# Patient Record
Sex: Female | Born: 1955 | Race: White | Hispanic: No | Marital: Single
Health system: Southern US, Community
[De-identification: ages and names within clinical notes are randomized; demographics above are authoritative.]

## PROBLEM LIST (undated history)

## (undated) DIAGNOSIS — Z87312 Personal history of (healed) stress fracture: Secondary | ICD-10-CM

## (undated) DIAGNOSIS — D329 Benign neoplasm of meninges, unspecified: Secondary | ICD-10-CM

## (undated) DIAGNOSIS — F909 Attention-deficit hyperactivity disorder, unspecified type: Secondary | ICD-10-CM

## (undated) DIAGNOSIS — F1920 Other psychoactive substance dependence, uncomplicated: Secondary | ICD-10-CM

## (undated) DIAGNOSIS — T7840XA Allergy, unspecified, initial encounter: Secondary | ICD-10-CM

## (undated) DIAGNOSIS — T782XXA Anaphylactic shock, unspecified, initial encounter: Secondary | ICD-10-CM

## (undated) DIAGNOSIS — M2170 Unequal limb length (acquired), unspecified site: Secondary | ICD-10-CM

## (undated) DIAGNOSIS — K589 Irritable bowel syndrome without diarrhea: Secondary | ICD-10-CM

## (undated) HISTORY — DX: Attention-deficit hyperactivity disorder, unspecified type: F90.9

## (undated) HISTORY — DX: Personal history of (healed) stress fracture: Z87.312

## (undated) HISTORY — DX: Benign neoplasm of meninges, unspecified: D32.9

## (undated) HISTORY — DX: Other psychoactive substance dependence, uncomplicated: F19.20

## (undated) HISTORY — DX: Allergy, unspecified, initial encounter: T78.40XA

## (undated) HISTORY — DX: Anaphylactic shock, unspecified, initial encounter: T78.2XXA

## (undated) HISTORY — DX: Irritable bowel syndrome, unspecified: K58.9

## (undated) HISTORY — DX: Unequal limb length (acquired), unspecified site: M21.70

---

## 1998-03-01 ENCOUNTER — Inpatient Hospital Stay (HOSPITAL_COMMUNITY): Admission: AD | Admit: 1998-03-01 | Discharge: 1998-03-03 | Payer: Self-pay | Admitting: Psychiatry

## 1998-03-09 ENCOUNTER — Encounter (HOSPITAL_COMMUNITY): Admission: RE | Admit: 1998-03-09 | Discharge: 1998-06-07 | Payer: Self-pay | Admitting: Psychiatry

## 2000-02-07 ENCOUNTER — Encounter: Payer: Self-pay | Admitting: Obstetrics and Gynecology

## 2000-02-07 ENCOUNTER — Encounter: Admission: RE | Admit: 2000-02-07 | Discharge: 2000-02-07 | Payer: Self-pay | Admitting: Obstetrics and Gynecology

## 2000-08-05 ENCOUNTER — Encounter: Admission: RE | Admit: 2000-08-05 | Discharge: 2000-08-05 | Payer: Self-pay | Admitting: Sports Medicine

## 2000-09-24 DIAGNOSIS — Z87312 Personal history of (healed) stress fracture: Secondary | ICD-10-CM

## 2000-09-24 DIAGNOSIS — T782XXA Anaphylactic shock, unspecified, initial encounter: Secondary | ICD-10-CM

## 2000-09-24 HISTORY — DX: Personal history of (healed) stress fracture: Z87.312

## 2000-09-24 HISTORY — DX: Anaphylactic shock, unspecified, initial encounter: T78.2XXA

## 2000-10-11 ENCOUNTER — Encounter: Admission: RE | Admit: 2000-10-11 | Discharge: 2000-10-11 | Payer: Self-pay | Admitting: Sports Medicine

## 2000-11-22 HISTORY — PX: OTHER SURGICAL HISTORY: SHX169

## 2000-12-09 ENCOUNTER — Encounter: Payer: Self-pay | Admitting: Internal Medicine

## 2000-12-09 ENCOUNTER — Ambulatory Visit (HOSPITAL_COMMUNITY): Admission: RE | Admit: 2000-12-09 | Discharge: 2000-12-09 | Payer: Self-pay | Admitting: Internal Medicine

## 2000-12-13 ENCOUNTER — Encounter: Admission: RE | Admit: 2000-12-13 | Discharge: 2000-12-13 | Payer: Self-pay | Admitting: Family Medicine

## 2000-12-23 ENCOUNTER — Encounter: Admission: RE | Admit: 2000-12-23 | Discharge: 2000-12-23 | Payer: Self-pay | Admitting: Sports Medicine

## 2001-01-10 ENCOUNTER — Encounter: Admission: RE | Admit: 2001-01-10 | Discharge: 2001-01-10 | Payer: Self-pay | Admitting: Sports Medicine

## 2001-02-04 ENCOUNTER — Other Ambulatory Visit: Admission: RE | Admit: 2001-02-04 | Discharge: 2001-02-04 | Payer: Self-pay | Admitting: Obstetrics and Gynecology

## 2001-02-17 ENCOUNTER — Encounter: Payer: Self-pay | Admitting: Obstetrics and Gynecology

## 2001-02-17 ENCOUNTER — Encounter: Admission: RE | Admit: 2001-02-17 | Discharge: 2001-02-17 | Payer: Self-pay | Admitting: Obstetrics and Gynecology

## 2001-03-06 ENCOUNTER — Inpatient Hospital Stay (HOSPITAL_COMMUNITY): Admission: EM | Admit: 2001-03-06 | Discharge: 2001-03-07 | Payer: Self-pay | Admitting: Family Medicine

## 2001-05-02 ENCOUNTER — Encounter: Admission: RE | Admit: 2001-05-02 | Discharge: 2001-05-02 | Payer: Self-pay | Admitting: Family Medicine

## 2001-10-28 ENCOUNTER — Encounter: Admission: RE | Admit: 2001-10-28 | Discharge: 2001-10-28 | Payer: Self-pay | Admitting: Sports Medicine

## 2001-11-11 ENCOUNTER — Encounter: Admission: RE | Admit: 2001-11-11 | Discharge: 2001-11-11 | Payer: Self-pay | Admitting: Sports Medicine

## 2001-11-17 ENCOUNTER — Encounter: Payer: Self-pay | Admitting: Internal Medicine

## 2001-12-02 ENCOUNTER — Encounter: Admission: RE | Admit: 2001-12-02 | Discharge: 2001-12-02 | Payer: Self-pay | Admitting: Sports Medicine

## 2001-12-17 ENCOUNTER — Encounter: Payer: Self-pay | Admitting: Internal Medicine

## 2001-12-21 ENCOUNTER — Emergency Department (HOSPITAL_COMMUNITY): Admission: EM | Admit: 2001-12-21 | Discharge: 2001-12-21 | Payer: Self-pay | Admitting: Emergency Medicine

## 2001-12-26 ENCOUNTER — Encounter: Payer: Self-pay | Admitting: Internal Medicine

## 2001-12-31 ENCOUNTER — Encounter: Admission: RE | Admit: 2001-12-31 | Discharge: 2002-03-31 | Payer: Self-pay | Admitting: Neurosurgery

## 2002-01-08 ENCOUNTER — Encounter: Payer: Self-pay | Admitting: Neurology

## 2002-01-08 ENCOUNTER — Ambulatory Visit (HOSPITAL_COMMUNITY): Admission: RE | Admit: 2002-01-08 | Discharge: 2002-01-08 | Payer: Self-pay | Admitting: Neurology

## 2002-01-19 ENCOUNTER — Encounter: Payer: Self-pay | Admitting: Internal Medicine

## 2002-03-23 ENCOUNTER — Encounter: Payer: Self-pay | Admitting: Internal Medicine

## 2002-03-31 ENCOUNTER — Encounter: Admission: RE | Admit: 2002-03-31 | Discharge: 2002-03-31 | Payer: Self-pay | Admitting: Family Medicine

## 2002-05-18 ENCOUNTER — Encounter: Admission: RE | Admit: 2002-05-18 | Discharge: 2002-05-18 | Payer: Self-pay | Admitting: Family Medicine

## 2002-05-21 ENCOUNTER — Encounter: Payer: Self-pay | Admitting: Obstetrics and Gynecology

## 2002-05-21 ENCOUNTER — Encounter: Admission: RE | Admit: 2002-05-21 | Discharge: 2002-05-21 | Payer: Self-pay | Admitting: Obstetrics and Gynecology

## 2002-05-27 ENCOUNTER — Encounter: Admission: RE | Admit: 2002-05-27 | Discharge: 2002-05-27 | Payer: Self-pay | Admitting: Neurology

## 2002-05-27 ENCOUNTER — Encounter: Payer: Self-pay | Admitting: Neurology

## 2002-06-23 ENCOUNTER — Encounter: Admission: RE | Admit: 2002-06-23 | Discharge: 2002-06-23 | Payer: Self-pay | Admitting: Sports Medicine

## 2002-09-08 ENCOUNTER — Encounter: Admission: RE | Admit: 2002-09-08 | Discharge: 2002-09-08 | Payer: Self-pay | Admitting: Sports Medicine

## 2002-09-22 ENCOUNTER — Encounter: Admission: RE | Admit: 2002-09-22 | Discharge: 2002-09-22 | Payer: Self-pay | Admitting: Sports Medicine

## 2002-09-22 ENCOUNTER — Encounter: Payer: Self-pay | Admitting: Sports Medicine

## 2003-06-15 ENCOUNTER — Encounter: Admission: RE | Admit: 2003-06-15 | Discharge: 2003-06-15 | Payer: Self-pay | Admitting: Sports Medicine

## 2003-07-01 ENCOUNTER — Encounter: Payer: Self-pay | Admitting: Obstetrics and Gynecology

## 2003-07-01 ENCOUNTER — Ambulatory Visit (HOSPITAL_COMMUNITY): Admission: RE | Admit: 2003-07-01 | Discharge: 2003-07-01 | Payer: Self-pay | Admitting: Obstetrics and Gynecology

## 2003-07-06 ENCOUNTER — Other Ambulatory Visit: Admission: RE | Admit: 2003-07-06 | Discharge: 2003-07-06 | Payer: Self-pay | Admitting: Obstetrics and Gynecology

## 2003-08-31 ENCOUNTER — Encounter: Admission: RE | Admit: 2003-08-31 | Discharge: 2003-08-31 | Payer: Self-pay | Admitting: Sports Medicine

## 2004-03-15 ENCOUNTER — Encounter: Admission: RE | Admit: 2004-03-15 | Discharge: 2004-03-15 | Payer: Self-pay | Admitting: Internal Medicine

## 2004-08-25 ENCOUNTER — Ambulatory Visit: Payer: Self-pay | Admitting: Internal Medicine

## 2004-10-06 ENCOUNTER — Ambulatory Visit: Payer: Self-pay | Admitting: Sports Medicine

## 2004-10-23 ENCOUNTER — Ambulatory Visit: Payer: Self-pay | Admitting: Internal Medicine

## 2004-11-09 ENCOUNTER — Ambulatory Visit (HOSPITAL_COMMUNITY): Admission: RE | Admit: 2004-11-09 | Discharge: 2004-11-09 | Payer: Self-pay | Admitting: Obstetrics and Gynecology

## 2004-12-07 ENCOUNTER — Encounter: Admission: RE | Admit: 2004-12-07 | Discharge: 2004-12-07 | Payer: Self-pay | Admitting: Obstetrics and Gynecology

## 2004-12-22 ENCOUNTER — Ambulatory Visit: Payer: Self-pay | Admitting: Sports Medicine

## 2005-05-16 ENCOUNTER — Ambulatory Visit: Payer: Self-pay | Admitting: Internal Medicine

## 2005-07-06 ENCOUNTER — Ambulatory Visit: Payer: Self-pay | Admitting: Internal Medicine

## 2005-10-30 ENCOUNTER — Ambulatory Visit: Payer: Self-pay | Admitting: Sports Medicine

## 2005-12-23 ENCOUNTER — Encounter: Payer: Self-pay | Admitting: Internal Medicine

## 2005-12-23 LAB — CONVERTED CEMR LAB

## 2005-12-25 ENCOUNTER — Ambulatory Visit: Payer: Self-pay | Admitting: Internal Medicine

## 2005-12-25 ENCOUNTER — Other Ambulatory Visit: Admission: RE | Admit: 2005-12-25 | Discharge: 2005-12-25 | Payer: Self-pay | Admitting: Internal Medicine

## 2005-12-25 ENCOUNTER — Encounter: Payer: Self-pay | Admitting: Internal Medicine

## 2006-01-25 ENCOUNTER — Ambulatory Visit: Payer: Self-pay | Admitting: Internal Medicine

## 2006-02-05 ENCOUNTER — Ambulatory Visit: Payer: Self-pay | Admitting: Internal Medicine

## 2006-07-23 ENCOUNTER — Ambulatory Visit: Payer: Self-pay | Admitting: Sports Medicine

## 2006-11-05 ENCOUNTER — Encounter: Admission: RE | Admit: 2006-11-05 | Discharge: 2006-11-05 | Payer: Self-pay | Admitting: Obstetrics and Gynecology

## 2006-11-15 ENCOUNTER — Ambulatory Visit: Payer: Self-pay | Admitting: Internal Medicine

## 2007-01-28 ENCOUNTER — Encounter: Admission: RE | Admit: 2007-01-28 | Discharge: 2007-01-28 | Payer: Self-pay | Admitting: Obstetrics and Gynecology

## 2007-05-02 ENCOUNTER — Ambulatory Visit: Payer: Self-pay | Admitting: Internal Medicine

## 2007-05-13 ENCOUNTER — Ambulatory Visit: Payer: Self-pay | Admitting: Internal Medicine

## 2007-05-13 ENCOUNTER — Encounter: Payer: Self-pay | Admitting: Internal Medicine

## 2007-05-13 ENCOUNTER — Telehealth: Payer: Self-pay | Admitting: Internal Medicine

## 2007-05-14 ENCOUNTER — Encounter: Payer: Self-pay | Admitting: Internal Medicine

## 2007-05-14 LAB — CONVERTED CEMR LAB: Hep B S AB Quant (Post): 8 milliintl units/mL

## 2007-05-23 ENCOUNTER — Encounter (INDEPENDENT_AMBULATORY_CARE_PROVIDER_SITE_OTHER): Payer: Self-pay | Admitting: Surgery

## 2007-05-23 ENCOUNTER — Ambulatory Visit (HOSPITAL_BASED_OUTPATIENT_CLINIC_OR_DEPARTMENT_OTHER): Admission: RE | Admit: 2007-05-23 | Discharge: 2007-05-23 | Payer: Self-pay | Admitting: Surgery

## 2007-05-27 ENCOUNTER — Telehealth: Payer: Self-pay | Admitting: Internal Medicine

## 2007-05-27 ENCOUNTER — Encounter: Payer: Self-pay | Admitting: Internal Medicine

## 2007-05-27 DIAGNOSIS — Z8719 Personal history of other diseases of the digestive system: Secondary | ICD-10-CM

## 2007-05-28 ENCOUNTER — Ambulatory Visit: Payer: Self-pay | Admitting: Internal Medicine

## 2007-06-24 ENCOUNTER — Ambulatory Visit: Payer: Self-pay | Admitting: Internal Medicine

## 2007-06-26 LAB — CONVERTED CEMR LAB: Hepatitis B-Post: >1000 milliintl units/mL

## 2007-07-12 ENCOUNTER — Encounter: Admission: RE | Admit: 2007-07-12 | Discharge: 2007-07-12 | Payer: Self-pay | Admitting: Sports Medicine

## 2007-09-30 ENCOUNTER — Telehealth: Payer: Self-pay | Admitting: Internal Medicine

## 2008-01-14 ENCOUNTER — Telehealth (INDEPENDENT_AMBULATORY_CARE_PROVIDER_SITE_OTHER): Payer: Self-pay | Admitting: *Deleted

## 2008-05-14 ENCOUNTER — Ambulatory Visit: Payer: Self-pay | Admitting: Family Medicine

## 2008-05-17 ENCOUNTER — Encounter: Admission: RE | Admit: 2008-05-17 | Discharge: 2008-05-17 | Payer: Self-pay | Admitting: Obstetrics and Gynecology

## 2008-05-24 ENCOUNTER — Encounter: Admission: RE | Admit: 2008-05-24 | Discharge: 2008-05-24 | Payer: Self-pay | Admitting: Obstetrics and Gynecology

## 2008-08-02 ENCOUNTER — Encounter: Payer: Self-pay | Admitting: Internal Medicine

## 2008-12-20 ENCOUNTER — Telehealth: Payer: Self-pay | Admitting: Internal Medicine

## 2009-01-17 ENCOUNTER — Telehealth: Payer: Self-pay | Admitting: Internal Medicine

## 2009-02-02 ENCOUNTER — Telehealth (INDEPENDENT_AMBULATORY_CARE_PROVIDER_SITE_OTHER): Payer: Self-pay | Admitting: *Deleted

## 2009-02-03 ENCOUNTER — Telehealth: Payer: Self-pay | Admitting: Family Medicine

## 2009-02-03 ENCOUNTER — Ambulatory Visit: Payer: Self-pay | Admitting: Internal Medicine

## 2009-02-03 DIAGNOSIS — L255 Unspecified contact dermatitis due to plants, except food: Secondary | ICD-10-CM

## 2009-02-03 DIAGNOSIS — K5900 Constipation, unspecified: Secondary | ICD-10-CM | POA: Insufficient documentation

## 2009-02-04 ENCOUNTER — Telehealth: Payer: Self-pay | Admitting: Internal Medicine

## 2009-02-09 ENCOUNTER — Telehealth: Payer: Self-pay | Admitting: Internal Medicine

## 2009-02-11 ENCOUNTER — Ambulatory Visit: Payer: Self-pay | Admitting: Gastroenterology

## 2009-02-11 DIAGNOSIS — K6289 Other specified diseases of anus and rectum: Secondary | ICD-10-CM

## 2009-02-11 DIAGNOSIS — N949 Unspecified condition associated with female genital organs and menstrual cycle: Secondary | ICD-10-CM | POA: Insufficient documentation

## 2009-02-14 ENCOUNTER — Encounter: Payer: Self-pay | Admitting: Internal Medicine

## 2009-02-14 ENCOUNTER — Telehealth: Payer: Self-pay | Admitting: Gastroenterology

## 2009-02-14 ENCOUNTER — Telehealth: Payer: Self-pay | Admitting: Family Medicine

## 2009-02-15 ENCOUNTER — Telehealth: Payer: Self-pay | Admitting: Internal Medicine

## 2009-03-15 ENCOUNTER — Ambulatory Visit: Payer: Self-pay | Admitting: Internal Medicine

## 2009-03-18 LAB — CONVERTED CEMR LAB
Basophils Relative: 0 % (ref 0.0–3.0)
Bilirubin, Direct: 0 mg/dL (ref 0.0–0.3)
Eosinophils Absolute: 0.1 10*3/uL (ref 0.0–0.7)
Lipase: 2 units/L — ABNORMAL LOW (ref 11.0–59.0)
Lymphocytes Relative: 32.9 % (ref 12.0–46.0)
Lymphs Abs: 2.2 10*3/uL (ref 0.7–4.0)
MCHC: 34.6 g/dL (ref 30.0–36.0)
Monocytes Absolute: 0.3 10*3/uL (ref 0.1–1.0)
Neutro Abs: 4.1 10*3/uL (ref 1.4–7.7)
Neutrophils Relative %: 60 % (ref 43.0–77.0)
Platelets: 273 10*3/uL (ref 150.0–400.0)
RDW: 12.2 % (ref 11.5–14.6)
Total Bilirubin: 0.4 mg/dL (ref 0.3–1.2)
Total Protein: 7.2 g/dL (ref 6.0–8.3)
WBC: 6.7 10*3/uL (ref 4.5–10.5)

## 2009-03-21 ENCOUNTER — Telehealth (INDEPENDENT_AMBULATORY_CARE_PROVIDER_SITE_OTHER): Payer: Self-pay | Admitting: *Deleted

## 2009-04-29 ENCOUNTER — Ambulatory Visit: Payer: Self-pay | Admitting: Internal Medicine

## 2009-04-29 DIAGNOSIS — H9209 Otalgia, unspecified ear: Secondary | ICD-10-CM | POA: Insufficient documentation

## 2009-05-13 ENCOUNTER — Ambulatory Visit: Payer: Self-pay | Admitting: Internal Medicine

## 2009-05-14 DIAGNOSIS — J309 Allergic rhinitis, unspecified: Secondary | ICD-10-CM | POA: Insufficient documentation

## 2009-05-21 ENCOUNTER — Telehealth: Payer: Self-pay | Admitting: Internal Medicine

## 2009-06-07 ENCOUNTER — Telehealth: Payer: Self-pay | Admitting: *Deleted

## 2009-07-05 ENCOUNTER — Telehealth: Payer: Self-pay | Admitting: *Deleted

## 2009-08-05 ENCOUNTER — Telehealth: Payer: Self-pay | Admitting: Internal Medicine

## 2009-08-09 ENCOUNTER — Telehealth: Payer: Self-pay | Admitting: *Deleted

## 2009-08-09 ENCOUNTER — Telehealth: Payer: Self-pay | Admitting: Internal Medicine

## 2009-08-23 ENCOUNTER — Encounter (INDEPENDENT_AMBULATORY_CARE_PROVIDER_SITE_OTHER): Payer: Self-pay | Admitting: *Deleted

## 2009-08-24 ENCOUNTER — Encounter (INDEPENDENT_AMBULATORY_CARE_PROVIDER_SITE_OTHER): Payer: Self-pay

## 2009-09-09 ENCOUNTER — Telehealth: Payer: Self-pay | Admitting: Internal Medicine

## 2009-09-15 ENCOUNTER — Encounter: Admission: RE | Admit: 2009-09-15 | Discharge: 2009-09-15 | Payer: Self-pay | Admitting: Obstetrics and Gynecology

## 2009-10-10 ENCOUNTER — Telehealth: Payer: Self-pay | Admitting: Internal Medicine

## 2009-10-18 ENCOUNTER — Telehealth: Payer: Self-pay | Admitting: Gastroenterology

## 2009-10-20 ENCOUNTER — Telehealth: Payer: Self-pay | Admitting: Gastroenterology

## 2009-10-31 ENCOUNTER — Telehealth: Payer: Self-pay | Admitting: *Deleted

## 2009-11-02 ENCOUNTER — Encounter: Admission: RE | Admit: 2009-11-02 | Discharge: 2009-11-02 | Payer: Self-pay | Admitting: Internal Medicine

## 2009-11-07 ENCOUNTER — Telehealth: Payer: Self-pay | Admitting: *Deleted

## 2009-11-17 ENCOUNTER — Encounter: Payer: Self-pay | Admitting: Internal Medicine

## 2009-12-06 ENCOUNTER — Telehealth: Payer: Self-pay | Admitting: Internal Medicine

## 2010-01-02 ENCOUNTER — Encounter: Admission: RE | Admit: 2010-01-02 | Discharge: 2010-01-02 | Payer: Self-pay | Admitting: Neurosurgery

## 2010-01-16 ENCOUNTER — Ambulatory Visit: Payer: Self-pay | Admitting: Internal Medicine

## 2010-01-16 DIAGNOSIS — T782XXA Anaphylactic shock, unspecified, initial encounter: Secondary | ICD-10-CM

## 2010-01-16 DIAGNOSIS — R51 Headache: Secondary | ICD-10-CM

## 2010-01-16 DIAGNOSIS — R519 Headache, unspecified: Secondary | ICD-10-CM | POA: Insufficient documentation

## 2010-01-16 DIAGNOSIS — F909 Attention-deficit hyperactivity disorder, unspecified type: Secondary | ICD-10-CM | POA: Insufficient documentation

## 2010-02-03 ENCOUNTER — Telehealth: Payer: Self-pay | Admitting: Internal Medicine

## 2010-02-08 ENCOUNTER — Telehealth: Payer: Self-pay | Admitting: Internal Medicine

## 2010-02-10 ENCOUNTER — Telehealth: Payer: Self-pay | Admitting: Internal Medicine

## 2010-02-15 ENCOUNTER — Ambulatory Visit: Payer: Self-pay | Admitting: Internal Medicine

## 2010-02-15 ENCOUNTER — Telehealth: Payer: Self-pay | Admitting: Internal Medicine

## 2010-02-15 DIAGNOSIS — R209 Unspecified disturbances of skin sensation: Secondary | ICD-10-CM

## 2010-02-15 LAB — CONVERTED CEMR LAB
ALT: 14 units/L (ref 0–35)
AST: 20 units/L (ref 0–37)
Albumin: 3.6 g/dL (ref 3.5–5.2)
Alkaline Phosphatase: 84 units/L (ref 39–117)
Basophils Absolute: 0.1 10*3/uL (ref 0.0–0.1)
CO2: 27 meq/L (ref 19–32)
Calcium: 9.4 mg/dL (ref 8.4–10.5)
Eosinophils Absolute: 0.2 10*3/uL (ref 0.0–0.7)
Eosinophils Relative: 3.8 % (ref 0.0–5.0)
Glucose, Bld: 87 mg/dL (ref 70–99)
Hemoglobin: 12.3 g/dL (ref 12.0–15.0)
Lymphs Abs: 1.7 10*3/uL (ref 0.7–4.0)
MCHC: 33.6 g/dL (ref 30.0–36.0)
MCV: 92.2 fL (ref 78.0–100.0)
Monocytes Absolute: 0.4 10*3/uL (ref 0.1–1.0)
Monocytes Relative: 6.1 % (ref 3.0–12.0)
RDW: 13.2 % (ref 11.5–14.6)
WBC: 5.9 10*3/uL (ref 4.5–10.5)

## 2010-02-24 LAB — CONVERTED CEMR LAB
ANA Titer 1: NEGATIVE
Anti Nuclear Antibody(ANA): POSITIVE — AB
HCV Ab: NEGATIVE

## 2010-02-28 ENCOUNTER — Telehealth: Payer: Self-pay | Admitting: Internal Medicine

## 2010-03-02 ENCOUNTER — Emergency Department (HOSPITAL_COMMUNITY): Admission: EM | Admit: 2010-03-02 | Discharge: 2010-03-02 | Payer: Self-pay | Admitting: Emergency Medicine

## 2010-03-02 ENCOUNTER — Encounter: Payer: Self-pay | Admitting: Internal Medicine

## 2010-03-15 ENCOUNTER — Telehealth: Payer: Self-pay | Admitting: Internal Medicine

## 2010-03-22 ENCOUNTER — Telehealth: Payer: Self-pay | Admitting: Internal Medicine

## 2010-03-22 ENCOUNTER — Ambulatory Visit: Payer: Self-pay | Admitting: Infectious Diseases

## 2010-04-07 ENCOUNTER — Encounter: Payer: Self-pay | Admitting: Internal Medicine

## 2010-04-26 ENCOUNTER — Telehealth: Payer: Self-pay | Admitting: *Deleted

## 2010-06-16 ENCOUNTER — Telehealth: Payer: Self-pay | Admitting: Family Medicine

## 2010-07-27 ENCOUNTER — Telehealth: Payer: Self-pay | Admitting: Internal Medicine

## 2010-08-23 ENCOUNTER — Ambulatory Visit: Payer: Self-pay | Admitting: Sports Medicine

## 2010-08-23 DIAGNOSIS — M7750 Other enthesopathy of unspecified foot: Secondary | ICD-10-CM | POA: Insufficient documentation

## 2010-08-23 DIAGNOSIS — R269 Unspecified abnormalities of gait and mobility: Secondary | ICD-10-CM

## 2010-08-23 DIAGNOSIS — M2170 Unequal limb length (acquired), unspecified site: Secondary | ICD-10-CM

## 2010-08-23 DIAGNOSIS — M533 Sacrococcygeal disorders, not elsewhere classified: Secondary | ICD-10-CM | POA: Insufficient documentation

## 2010-08-29 ENCOUNTER — Telehealth: Payer: Self-pay | Admitting: *Deleted

## 2010-08-31 ENCOUNTER — Emergency Department (HOSPITAL_COMMUNITY): Admission: EM | Admit: 2010-08-31 | Discharge: 2010-03-08 | Payer: Self-pay | Admitting: Emergency Medicine

## 2010-09-28 ENCOUNTER — Telehealth: Payer: Self-pay | Admitting: Internal Medicine

## 2010-10-15 ENCOUNTER — Encounter: Payer: Self-pay | Admitting: Internal Medicine

## 2010-10-15 ENCOUNTER — Encounter: Payer: Self-pay | Admitting: Obstetrics and Gynecology

## 2010-10-15 ENCOUNTER — Encounter: Payer: Self-pay | Admitting: Neurosurgery

## 2010-10-16 ENCOUNTER — Encounter: Payer: Self-pay | Admitting: Obstetrics and Gynecology

## 2010-10-16 ENCOUNTER — Encounter: Payer: Self-pay | Admitting: Internal Medicine

## 2010-10-18 ENCOUNTER — Other Ambulatory Visit: Payer: Self-pay | Admitting: Internal Medicine

## 2010-10-18 DIAGNOSIS — Z1239 Encounter for other screening for malignant neoplasm of breast: Secondary | ICD-10-CM

## 2010-10-18 DIAGNOSIS — Z1231 Encounter for screening mammogram for malignant neoplasm of breast: Secondary | ICD-10-CM

## 2010-10-24 NOTE — Assessment & Plan Note (Signed)
Summary: ORTHOTICS,MC   Vital Signs:  Patient profile:   55 year old female Menstrual status:  postmenopausal Height:      61 inches Weight:      150 pounds BMI:     28.44 Pulse rate:   64 / minute BP sitting:   129 / 84  (right arm)  Vitals Entered By: Rochele Pages RN (August 23, 2010 11:31 AM) CC: new orthotics   Referring Provider:  Berniece Andreas, MD Primary Provider:  Berniece Andreas, MD  CC:  new orthotics.  History of Present Illness: Pt reports to clinic for new orthotics.  States that Dr. Darrick Penna has made 2 pairs for her in the past, but she no longer has them.  Reports no problems, but would like a new pair of orthotics.  Ran in the past, but stopped 2 years ago, not doing any type of regular exercise.   Preventive Screening-Counseling & Management  Alcohol-Tobacco     Smoking Status: quit > 6 months     Year Quit: 1986  Allergies: 1)  ! * Bee Sting  Social History: Smoking Status:  quit > 6 months  Physical Exam  General:  Well-developed,well-nourished,in no acute distress; alert,appropriate and cooperative throughout examination Msk:  Halux rigidus rt Large Morton's calus bilaterally Dense heel calus on rt Faber test tighter on rt than left Hip strength- 4+/5 abduction on left, 4-/5 abduction on rt  Rt leg 1 cm shorter than left    Impression & Recommendations:  Problem # 1:  ABNORMALITY OF GAIT (ICD-781.2)  patient has a pronated gait 2/2 marked pes planus and collapse of both long and transverse arch  also has leg length difference causeing some trendelenburg to RT without correction  Patient was fitted for a standard, cushioned, semi-rigid orthotic.  The orthotic was heated and the patient stood on the orthotic blank positioned on the orthotic stand. The patient was positioned in subtalar neutral position and 10 degrees of ankle dorsiflexion in a weight bearing stance. After completion of molding a stable based was applied to the orthotic blank.     The blank was ground to a stable position for weight bearing. 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 Use as needed 2)  Elestrin 0.52 Mg/0.87 Gm (0.06%) Gel (Estradiol) .... Once a day 3)  Prometrium 100 Mg Caps (Progesterone micronized) .Marland Kitchen.. 1 by mouth once daily 4)  Vyvanse 70 Mg Caps (Lisdexamfetamine dimesylate) .Marland Kitchen.. 1 by mouth once daily 5)  Bactrim Ds 800-160 Mg Tabs (Sulfamethoxazole-trimethoprim) .Marland Kitchen.. 1 by mouth two times a day  Patient Instructions: 1)  Do standing hip rotations starting with 3 sets of 10  2)  Pretzel  stretch 3 times for 30 seconds   Orders Added: 1)  New Patient Level II [99202] 2)  Orthotic Materials, each unit [L3002]    Prevention & Chronic Care Immunizations   Influenza vaccine: Not documented    Tetanus booster: 05/02/2007: Tdap    Pneumococcal vaccine: Not documented  Colorectal Screening   Hemoccult: Not documented    Colonoscopy: Not documented  Other Screening   Pap smear: historical  (12/23/2005)    Mammogram: Not documented   Smoking status: quit > 6 months  (08/23/2010)  Lipids   Total Cholesterol: Not documented    LDL: Not documented   LDL Direct: Not documented   HDL: Not documented   Triglycerides: Not documented

## 2010-10-24 NOTE — Progress Notes (Signed)
Summary: refill on vyvanse  Phone Note Call from Patient Call back at Home Phone [phone]   Caller: Patient Summary of Call: Pt needs a refill on vyvanse Initial call taken by: Romualdo Bolk, CMA (AAMA),  July 27, 2010 11:51 AM  Follow-up for Phone Call        Pt aware that rx is ready to pick up. Follow-up by: Romualdo Bolk, CMA (AAMA),  July 27, 2010 11:57 AM    Prescriptions: VYVANSE 70 MG CAPS (LISDEXAMFETAMINE DIMESYLATE) 1 by mouth once daily  #30 x 0   Entered by:   Romualdo Bolk, CMA (AAMA)   Authorized by:   Madelin Headings MD   Signed by:   Romualdo Bolk, CMA (AAMA) on 07/27/2010   Method used:   Print then Give to Patient   RxID:   [id number]

## 2010-10-24 NOTE — Progress Notes (Signed)
Summary: refill  Phone Note Refill Request Call back at Home Phone [phone] Message from:  Patient---live call  Refills Requested: Medication #1:  VYVANSE 70 MG CAPS 1 by mouth once daily call when ready.  Initial call taken by: Warnell Forester,  June 16, 2010 3:28 PM  Follow-up for Phone Call        done Follow-up by: Nelwyn Salisbury MD,  June 16, 2010 3:47 PM  Additional Follow-up for Phone Call Additional follow up Details #1::        pt aware. Additional Follow-up by: Pura Spice, RN,  June 16, 2010 4:20 PM    Prescriptions: VYVANSE 70 MG CAPS (LISDEXAMFETAMINE DIMESYLATE) 1 by mouth once daily  #30 x 0   Entered and Authorized by:   Nelwyn Salisbury MD   Signed by:   Nelwyn Salisbury MD on 06/16/2010   Method used:   Print then Give to Patient   RxID:   [id number]

## 2010-10-24 NOTE — Progress Notes (Signed)
Summary: REQ FOR APPT?  Phone Note Call from Patient   Caller: Patient [phone] Reason for Call: Talk to Nurse Summary of Call: Pt called to speak with Carollee Herter, CMA or Dr Fabian Sharp in ref to being seen today.... Pt had appt on Friday, 10/28/2009 but cancelled.... Pt adv that she has same sxs (no worse) that she was exp on Friday and will probably need to be sent for an MRI.... Triage N/A.... Can you advise if pt can be worked into schedule today? Offered other options but pt req to see Dr Fabian Sharp today.  Initial call taken by: Debbra Riding,  October 31, 2009 11:54 AM  Follow-up for Phone Call        Spoke to pt and she is having crazy pains in her head and back. Pt was suppose to see Dr. Jule Ser but her car died and couldn't make the appt. Pt called Dr. Antony Contras office and they are waiting to see if they can see her today. Pt is wanting a MRI because of the symptoms. I advised pt to wait and see what they say. Pt wants [place] to order an MRI while instead of making an appt for later this week with Dr. Fabian Sharp. Follow-up by: Romualdo Bolk, CMA Duncan Dull),  October 31, 2009 12:08 PM  Additional Follow-up for Phone Call Additional follow up Details #1::        It is possible the vyvanse could cause headaches but beacuse she has had a  condition seen by NS   they should see her and request what ever imaging study they deem is appropriate .   Insurance usually  would not let us schedule an imaging study without a visit and  even ,could be denied if not seen by specialist.  Additional Follow-up by: Madelin Headings MD,  November 03, 2009 5:53 PM    Additional Follow-up for Phone Call Additional follow up Details #2::    LMTOCB Romualdo Bolk, CMA Duncan Dull)  November 04, 2009 11:19 AM LMTOCB Romualdo Bolk, CMA Duncan Dull)  November 04, 2009 1:59 PM Pt has seen Dr. Jule Ser. See other triage message. Follow-up by: Romualdo Bolk, CMA (AAMA),  November 08, 2009 5:15 PM

## 2010-10-24 NOTE — Progress Notes (Signed)
Summary: refil  Phone Note Call from Patient Call back at Home Phone [phone]   Caller: Patient Summary of Call: refill on vyvanse 70mg  Initial call taken by: Romualdo Bolk, CMA (AAMA),  August 29, 2010 4:06 PM  Follow-up for Phone Call        Left message on machine that rx will be ready in the am. Follow-up by: Romualdo Bolk, CMA (AAMA),  August 29, 2010 4:06 PM    Prescriptions: VYVANSE 70 MG CAPS (LISDEXAMFETAMINE DIMESYLATE) 1 by mouth once daily  #30 x 0   Entered by:   Romualdo Bolk, CMA (AAMA)   Authorized by:   Madelin Headings MD   Signed by:   Romualdo Bolk, CMA (AAMA) on 08/29/2010   Method used:   Print then Give to Patient   RxID:   [id number]

## 2010-10-24 NOTE — Progress Notes (Signed)
Summary: refill on vyvanse and UTI sx.  Phone Note Call from Patient Call back at [facility] Phone [phone]   Caller: Patient Summary of Call: Pt needs a refill on vyvanse. Pt states that she has UTI. Pt would like some bactrum rx. Pt is having some burning upon urination, lower back pain, frequency and urgency. Pt states that she can't come in because she is going out of town.  Initial call taken by: Romualdo Bolk, CMA Duncan Dull),  April 26, 2010 3:53 PM  Follow-up for Phone Call        bactrim ds   disp 6#   1 by mouth two times a day  for 3 days   if continuing needs OV  and UA  Follow-up by: Madelin Headings MD,  April 26, 2010 5:05 PM  Additional Follow-up for Phone Call Additional follow up Details #1::        Pt aware and wants rx called into [facility]. Additional Follow-up by: Romualdo Bolk, CMA (AAMA),  April 26, 2010 5:08 PM    New/Updated Medications: VYVANSE 70 MG CAPS (LISDEXAMFETAMINE DIMESYLATE) 1 by mouth once daily BACTRIM DS 800-160 MG TABS (SULFAMETHOXAZOLE-TRIMETHOPRIM) 1 by mouth two times a day Prescriptions: BACTRIM DS 800-160 MG TABS (SULFAMETHOXAZOLE-TRIMETHOPRIM) 1 by mouth two times a day  #6 x 0   Entered by:   Romualdo Bolk, CMA (AAMA)   Authorized by:   Madelin Headings MD   Signed by:   Romualdo Bolk, CMA (AAMA) on 04/26/2010   Method used:   Electronically to        [facility] [phone]* (retail)       [address]       [place], [place]  [address]       Ph: [phone]       Fax: [phone]   RxID:   [id number] VYVANSE 70 MG CAPS (LISDEXAMFETAMINE DIMESYLATE) 1 by mouth once daily  #30 x 0   Entered by:   Romualdo Bolk, CMA (AAMA)   Authorized by:   Madelin Headings MD   Signed by:   Romualdo Bolk, CMA (AAMA) on 04/26/2010   Method used:   Print then Give to Patient   RxID:   [id number]

## 2010-10-24 NOTE — Progress Notes (Signed)
Summary: office visit?  Phone Note Call from Patient Call back at Home Phone [phone]   Caller: Patient Call For: Dr. Christella Hartigan Reason for Call: Talk to Nurse Summary of Call: pt complaining of "pressure on her colon" and would like to sch a COL... explained to pt that Dr. Christella Hartigan hasnt seen her in the office since May of 2010 and would most likely need to see her in the office again before she could sch a COL... pt wanted to speak to a nurse about this Initial call taken by: Vallarie Mare,  October 18, 2009 12:11 PM  Follow-up for Phone Call          Pt. says she did not keep prior appt. for colon at [facility] because she did not have insurance.Says she will be eligible for insurance with [employer] [phone]. Should she be scheduled here or at hospital.Says Dr.Panosh ref. her for screenig colon and denies feeling" pressure in colon" that she told front office she had. Follow-up by: Teryl Lucy RN,  October 18, 2009 1:57 PM  Additional Follow-up for Phone Call Additional follow up Details #1::        she no showed for previsit appt last month, she cancelled her previously scheduled colonoscopy last summer.  I would like to see her in office before committing to direct colonoscopy. Additional Follow-up by: Rachael Fee MD,  October 18, 2009 2:18 PM    Additional Follow-up for Phone Call Additional follow up Details #2::     Message left to call back  Teryl Lucy RN  October 18, 2009 2:31 PM  Message left to call back   Teryl Lucy RN  October 19, 2009 8:19 AM  Pt. called back and was informed she will need ov appt. Will call me  me later when she gets to work and checks her schedule.  Teryl Lucy RN  October 19, 2009 9:03 AM  Pt. has not called back to schedule an office appt so message left on home phone that if she desires to schedule an appt. she can call the scheduler. Follow-up by: Teryl Lucy RN,  October 20, 2009 11:10 AM

## 2010-10-24 NOTE — Progress Notes (Signed)
Summary: increasing the vyvanse  Phone Note Call from Patient Call back at Home Phone [phone]   Caller: Patient Summary of Call: Pt needs a refill on vyvanse. Pt would like to add on a 10mg  dose to the 70mg . She states that it is starting to wear off. Can we do this? Initial call taken by: Romualdo Bolk, CMA (AAMA),  November 07, 2009 11:14 AM  Follow-up for Phone Call        Dont feel comfortable dosing this high   . but can  schedule OV  to discuss . Also   best after she sees her speicalist for the HA .  Vyvanse could aggravated HAs. Follow-up by: Madelin Headings MD,  November 08, 2009 1:47 PM  Additional Follow-up for Phone Call Additional follow up Details #1::        Pt called to adv that she would like a refill RX on med for the regular Vyvanse 70mg  dosage.... Pt will schedule an OV for next month to discuss increasing dosage.... Pt states that she has already seen a specialist for HA and everything is OK.... Please call pt at [phone] when same is ready. Additional Follow-up by: Debbra Riding,  November 08, 2009 4:11 PM    Additional Follow-up for Phone Call Additional follow up Details #2::    Pt called and states that she will call back next month to schedule a follow up appt for a increase in her medication. Pt did see Dr. 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 She is going to follow them up about this. Pt states that she doesn't want to increase the medication for now. Follow-up by: Romualdo Bolk, CMA (AAMA),  November 08, 2009 4:31 PM  New/Updated Medications: VYVANSE 70 MG CAPS (LISDEXAMFETAMINE DIMESYLATE) 1 by mouth once daily Prescriptions: VYVANSE 70 MG CAPS (LISDEXAMFETAMINE DIMESYLATE) 1 by mouth once daily  #30 x 0   Entered by:   Romualdo Bolk, CMA (AAMA)   Authorized by:   Madelin Headings MD   Signed by:   Romualdo Bolk, CMA (AAMA) on 11/08/2009   Method used:   Print then Give to Patient  RxID:   [id number]

## 2010-10-24 NOTE — Assessment & Plan Note (Signed)
Summary: fu on meds/njr/pt rsc/cjr--PT [facility] // RS   Vital Signs:  Patient profile:   55 year old female Menstrual status:  postmenopausal Height:      61.25 inches Weight:      159 pounds BMI:     29.91 Pulse rate:   78 / minute BP sitting:   110 / 70  (right arm) Cuff size:   regular  Vitals Entered By: Romualdo Bolk, CMA (AAMA) (January 16, 2010 2:04 PM) CC: Follow-up visit on meds- Pt states that the vyvanse is working well.   History of Present Illness: Margaret Oconnor comesin for   .fu of   a number of iisues   She is on meds for adhd and taking regularly without sig se . She takes this on    work days   .She is going back to school for final semester.  Sleep:   Taking ambien   as needed  her specialist in [place] ..gets  about 8 hours sleep.   Allergies :  no rx now.      has hx of anaphylaxis in the remote past  .related to running exercise and ? heat.Needs epi pen.  Old one is expired. Hasnt neededit  HAs :  Had increased  and did haveMRI       No change  from meningioma   but Dr Newell Coral  rec neck  MRi   to check this. thinking some may be secondary to neck arthritis  HCM: Getting  colonscopy.  She is on HRT  . per Gyne.   Preventive Screening-Counseling & Management  Alcohol-Tobacco     Alcohol drinks/day: 0     Smoking Status: quit     Year Quit: 1985  Caffeine-Diet-Exercise     Caffeine use/day: 1     Does Patient Exercise: yes     Type of exercise: gym  Current Medications (verified): 1)  Epipen 2-Pak 0.3 Mg/0.61ml (1:1000)  Devi (Epinephrine Hcl (Anaphylaxis)) .... Use As Needed 2)  Vyvanse 70 Mg Caps (Lisdexamfetamine Dimesylate) .Marland Kitchen.. 1 By Mouth Once Daily  Allergies (verified): No Known Drug Allergies  Past History:  Past medical, surgical, family and social histories (including risk factors) reviewed, and no changes noted (except as noted below).  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 CTS ADHD  Past Surgical History: Reviewed history from 02/11/2009 and no changes required. stress fx R tibia  03/02   Past History:  Care Management: Gynecology: [facility] and NS in the Past  2003 Psych in [place]   Family History: Reviewed history from 05/13/2009 and no changes required. Father: hodgkin's disease Mother: Breast Cancer Siblings: Sister- healthy no colon cancer Nephew  maternal has  ADHD  [place] dads side  Depression  father  and moms side.  Uncle and GM .  NOt bipolar  Neg sd cardiac.    Social History: Reviewed history from 05/13/2009 and no changes required. works in psychiatry office psych nursing  counseling not exercising now no tob  no drugs  no alcohol. getting Psych NP degree [employer]   hh of 3   Review of Systems  The patient denies anorexia, fever, weight loss, weight gain, vision loss, decreased hearing, chest pain, syncope, dyspnea on exertion, peripheral edema, prolonged  cough, hemoptysis, abdominal pain, melena, transient blindness, difficulty walking, abnormal bleeding, enlarged lymph nodes, and angioedema.    Physical Exam  General:  alert, well-developed, well-nourished, and well-hydrated.   Head:  normocephalic and atraumatic.   Eyes:  vision grossly intact, pupils equal, and pupils round.   Ears:  R ear normal, L ear normal, and no external deformities.   Nose:  no external deformity and no external erythema.   Neck:  somewhat still  no masses  Lungs:  Normal respiratory effort, chest expands symmetrically. Lungs are clear to auscultation, no crackles or wheezes. Heart:  Normal rate and regular rhythm. S1 and S2 normal without gallop, murmur, click, rub or other extra sounds. Abdomen:  Bowel sounds positive,abdomen soft and non-tender without masses,  organomegaly or  noted. Msk:  some stiffness of neck  Pulses:  pulses intact without delay   Extremities:  no clubbing cyanosis or edema  Neurologic:  alert & oriented X3, strength normal in all extremities, and gait normal.   Skin:  sun changes no acute findings  Cervical Nodes:  No lymphadenopathy noted Psych:  Oriented X3, good eye contact, not anxious appearing, and not depressed appearing.   on task    Impression & Recommendations:  Problem # 1:  ATTENTION DEFICIT HYPERACTIVITY DISORDER, ADULT (ICD-314.01) doing well on med   at present  would not i ncrease dose but implement other parameters as needed   continue . no sig se of meds.  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  Complete Medication List: 1)  Epipen 2-pak 0.3 Mg/0.23ml (1:1000) Devi (Epinephrine hcl (anaphylaxis)) .... Use as needed 2)  Vyvanse 70 Mg Caps (Lisdexamfetamine dimesylate) .Marland Kitchen.. 1 by mouth once daily 3)  Elestrin 0.52 Mg/0.87 Gm (0.06%) Gel (Estradiol) .... Once a day 4)  Prometrium 100 Mg Caps (Progesterone micronized) .Marland Kitchen.. 1 by mouth once daily  Patient Instructions: 1)  colonscopy   2)  send copies of specialty evaluations for our record  and any labs done.  3)  return office visit in 6 months  or as needed. Prescriptions: VYVANSE 70 MG CAPS (LISDEXAMFETAMINE DIMESYLATE) 1 by mouth once daily  #30 x 0   Entered by:   Romualdo Bolk, CMA (AAMA)   Authorized by:   Madelin Headings MD   Signed by:   Romualdo Bolk, CMA (AAMA) on 01/16/2010   Method used:   Print then Give to Patient   RxID:   [id number] EPIPEN 2-PAK 0.3 MG/0.3ML (1:1000)  DEVI (EPINEPHRINE HCL (ANAPHYLAXIS)) Use as needed  #1 x 1   Entered by:   Romualdo Bolk, CMA (AAMA)    Authorized by:   Madelin Headings MD   Signed by:   Romualdo Bolk, CMA (AAMA) on 01/16/2010   Method used:   Print then Give to Patient   RxID:   [id number]

## 2010-10-24 NOTE — Progress Notes (Signed)
Summary: tick bite  Phone Note Call from Patient Call back at Home Phone [phone]   Caller: Patient Summary of Call: Pt is still having symptoms and is now having abd. pain and cramping. Pt had a tick was embedded inside. It was big x 1.5 weeks ago. Her side is still hurting. Tick bite was on rt side under rib cage, redness and white in the middle and not healing. No fever. Some nausea. Pt doesn't think this is the whole issue. No rash all over her. I offered her an appt today but pt refused. Pt can come in tomorrow. Initial call taken by: Romualdo Bolk, CMA (AAMA),  Feb 15, 2010 8:52 AM

## 2010-10-24 NOTE — Progress Notes (Signed)
Summary: Call-A-Nurse Report    Call-A-Nurse Triage Call Report Triage Record Num: [id number] Operator: April Finney Patient Name: Mattalyn Anderegg Call Date & Time: 02/07/2010 8:14:19PM Patient Phone: PCP: Neta Mends. Panosh Patient Gender: Female PCP Fax : [phone] Patient DOB: [birth year] [facility] Name: [facility] Reason for Call: Huntley Dec calling wanting refill on cream for some type of parasite. States she had it before and needs cream refilled due to white bumps on her arms and legs that itch. Instructed her we can not refill that medication after hours and she would need to be seen in U/C or ED or follow up with office in am. She states Dr.Panosh has not seen her for this before and another MD had prescibed it. She thinks it was called Premethrin. Protocol(s) Used: Medication Question Calls, No Triage (Adults) Recommended Outcome per Protocol: Call Provider within 24 Hours Reason for Outcome: Caller requesting a non urgent new prescription or refill and triager unable to refill per unit policy Care Advice:  ~ 02/07/2010 8:30:11PM Page 1 of 1 [email or web address]

## 2010-10-24 NOTE — Progress Notes (Signed)
Summary: new rx  Phone Note Call from Patient Call back at Home Phone [phone]   Caller: Patient Call For: Madelin Headings MD Summary of Call: pt cancelled ov today, pt is requesting new rx for vyvanse 70 mg Initial call taken by: Heron Sabins,  December 06, 2009 12:32 PM  Follow-up for Phone Call        Pt needs to schedule a follow up appt before next refill. LM on machine that rx is ready to pick up but that she needs a rov before next refill. Follow-up by: Romualdo Bolk, CMA (AAMA),  December 06, 2009 4:45 PM    New/Updated Medications: VYVANSE 70 MG CAPS (LISDEXAMFETAMINE DIMESYLATE) 1 by mouth once daily Prescriptions: VYVANSE 70 MG CAPS (LISDEXAMFETAMINE DIMESYLATE) 1 by mouth once daily  #30 x 0   Entered by:   Romualdo Bolk, CMA (AAMA)   Authorized by:   Madelin Headings MD   Signed by:   Romualdo Bolk, CMA (AAMA) on 12/06/2009   Method used:   Print then Give to Patient   RxID:   [id number]

## 2010-10-24 NOTE — Progress Notes (Signed)
Summary: refill on vyvanse  Phone Note Call from Patient Call back at [facility] Phone [phone]   Caller: Patient Summary of Call: refill on vyvanse 70mg  Initial call taken by: Romualdo Bolk, CMA Duncan Dull),  October 10, 2009 4:38 PM  Follow-up for Phone Call        Pt aware that rx is ready to pick up. Follow-up by: Romualdo Bolk, CMA (AAMA),  October 11, 2009 11:54 AM    Prescriptions: VYVANSE 70 MG CAPS (LISDEXAMFETAMINE DIMESYLATE) 1 by mouth once daily  #30 x 0   Entered by:   Romualdo Bolk, CMA (AAMA)   Authorized by:   Madelin Headings MD   Signed by:   Romualdo Bolk, CMA (AAMA) on 10/10/2009   Method used:   Print then Give to Patient   RxID:   [id number]

## 2010-10-24 NOTE — Assessment & Plan Note (Signed)
Summary: acute//ccm   Vital Signs:  Patient profile:   55 year old female Menstrual status:  postmenopausal Weight:      159 pounds Pulse rate:   66 / minute BP sitting:   120 / 80  (right arm) Cuff size:   regular  Vitals Entered By: Kathrynn Speed CMA (Feb 15, 2010 4:18 PM) CC: Tick bite on right side, Some redness, some abd. pain and Rt sided abd. tenderness. Pt states that she has something that comes off on her fingernails and a spot on her mouth. Pt states that she feels something crawling on her skin that are red dots. Pt is concerned about a fungus or parasite. Pt states that the derm didn't seen parasites in the vials. Pt got a lab order but hasn't gotten the labs done yet. Pt wants to have a stool specium done.    History of Present Illness: Jahayra Mazo comes in today  for acute visit for check tickbite and then follow up of skin sensatino of burning itchy feeling  at different parts of her body ususally related to household and  whe puts on sweaters from closet and ? other . doesnt know what this is and concerned about parasite of infection .Marland Kitchen rx with diflucan a year ago and then  noted house had bed bugs and signs resolved with this house treatment.    Went to derm who didnt see any burrowing mites or skin disease per se and rec lab tests.  she is concerned and anxious over this   Preventive Screening-Counseling & Management  Alcohol-Tobacco     Alcohol drinks/day: 0     Smoking Status: quit     Year Quit: 1985  Caffeine-Diet-Exercise     Caffeine use/day: 1     Does Patient Exercise: yes     Type of exercise: gym  Current Medications (verified): 1)  Epipen 2-Pak 0.3 Mg/0.51ml (1:1000)  Devi (Epinephrine Hcl (Anaphylaxis)) .... Use As Needed 2)  Vyvanse 70 Mg Caps (Lisdexamfetamine Dimesylate) .Marland Kitchen.. 1 By Mouth Once Daily 3)  Elestrin 0.52 Mg/0.87 Gm (0.06%) Gel (Estradiol) .... Once A Day 4)  Prometrium 100 Mg Caps (Progesterone Micronized) .Marland Kitchen.. 1 By Mouth Once  Daily  Allergies (verified): No Known Drug Allergies  Past History:  Past medical, surgical, family and social histories (including risk factors) reviewed, and no changes noted (except as noted below).  Past Medical History: Reviewed history from 01/16/2010 and no changes required. 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 CTS ADHD  Past Surgical History: Reviewed history from 02/11/2009 and no changes required. stress fx R tibia  03/02   Past History:  Care Management: Gynecology: [facility] and NS in the Past  2003 Psych in [place]  Dermatology: Terri Piedra  Family History: Reviewed history from 05/13/2009 and no changes required. Father: hodgkin's disease Mother: Breast Cancer Siblings: Sister- healthy no colon cancer Nephew  maternal has  ADHD  [place] dads side  Depression  father  and moms side.  Uncle and GM .  NOt bipolar  Neg sd cardiac.    Social History: Reviewed history from 01/16/2010 and no changes required. works in psychiatry office psych nursing  counseling not exercising now no tob  no drugs  no alcohol. getting Psych NP degree [employer]  hh of 3   Review of Systems  The patient denies anorexia, fever, weight loss, weight gain, abnormal bleeding, and enlarged lymph nodes.    Physical Exam  General:  Well-developed,well-nourished,in no acute distress; alert,appropriate and cooperative throughout examination Head:  normocephalic and atraumatic.   Skin:  right abdomen  with scab area with 3 mm redness and no discharge  no induration  rest of skin clear but sunchanges   nails no disease.  Cervical Nodes:  No lymphadenopathy noted Psych:  Oriented X3, good eye contact, and not depressed appearing.     Impression & Recommendations:  Problem #  1:  TICK BITE (ICD-E906.4) healing some local   irritiation   call if persistent or  progressive  local antibiotc ok.    Problem # 2:  DISTURBANCE OF SKIN SENSATION (ICD-782.0) for a months with hx of same last may that responded to fumigation of bed bugs in the house.   but now back waxes and wanes she doesnt think its   psychological but she is now almost obsessing about poss cause  at present .  She has hx of severe anaphlylaxis  in the past   perhaps some other sensitization  . Pattern shows  few .sx when not in the house area.     ok to do labs from derm   and add esr  and see allergy.     cannot tell why she has this problem   but has become disruptive so should monitor this.       doesn t think its the vyvanse  as had this before not on vyvanse.   referral to ID would not be helpful based on her signs .   at this time .     Complete Medication List: 1)  Epipen 2-pak 0.3 Mg/0.9ml (1:1000) Devi (Epinephrine hcl (anaphylaxis)) .... Use as needed 2)  Vyvanse 70 Mg Caps (Lisdexamfetamine dimesylate) .Marland Kitchen.. 1 by mouth once daily 3)  Elestrin 0.52 Mg/0.87 Gm (0.06%) Gel (Estradiol) .... Once a day 4)  Prometrium 100 Mg Caps (Progesterone micronized) .Marland Kitchen.. 1 by mouth once daily  Other Orders: Venipuncture (32202) TLB-CBC Platelet - w/Differential (85025-CBCD) TLB-T3, Free (Triiodothyronine) (84481-T3FREE) TLB-T4 (Thyrox), Free [phone]) TLB-TSH (Thyroid Stimulating Hormone) (84443-TSH) TLB-CMP (Comprehensive Metabolic Pnl) (80053-COMP) Hepatitis C Antibody (62831-51761) TLB-Sedimentation Rate (ESR) (85652-ESR) T-Antinuclear Antib (ANA) (60737-10626) T-Hepatitis C Antibody (94854-62703)  Patient Instructions: 1)  You will be informed of lab results when available.  2)  keep allergy appt. 3)  check about latex sensitivity  also 4)  avoid topicals and possible    irritants.    greater than 50% of visit spent in counseling   25 minutes

## 2010-10-24 NOTE — Progress Notes (Signed)
Summary: refill on vyvanse  Phone Note Call from Patient Call back at Home Phone [phone]   Caller: Patient Summary of Call: Pt needs a refill on vyvanse Initial call taken by: Romualdo Bolk, CMA (AAMA),  Feb 10, 2010 1:29 PM  Follow-up for Phone Call        Pt given rx. Follow-up by: Romualdo Bolk, CMA (AAMA),  Feb 10, 2010 1:30 PM    Prescriptions: VYVANSE 70 MG CAPS (LISDEXAMFETAMINE DIMESYLATE) 1 by mouth once daily  #30 x 0   Entered by:   Romualdo Bolk, CMA (AAMA)   Authorized by:   Madelin Headings MD   Signed by:   Romualdo Bolk, CMA (AAMA) on 02/10/2010   Method used:   Print then Give to Patient   RxID:   [id number]

## 2010-10-24 NOTE — Assessment & Plan Note (Signed)
Summary: new pt parasitic infection   Referring Provider:  Berniece Andreas, MD Primary Provider:  Berniece Andreas, MD  CC:  new patient parasitic infection.  History of Present Illness: 55 yo F with hx of 1 yearago traveling to [place] (June 2010). Afterwards felt "crawling" on her but did not see anything. Was seen by several MD's and PA who did not find anything wrong. Was givne diflucan for ? fungus and did feel a little better. She was .asx over the winter. Over last 6 weeks has had the same symptoms- crawling on her skin. has noted that her finger nails are tingling, numb and burning. Believes that her cuts are not healing as well, believes she has gotten bumps on her and vaginal d/c.  Has seen Dr Terri Piedra (derm), stool parasites NL, blood parasites NL,   ROS- no f/c, has had mild wt loss, constipation, nl urination, stopped menstrual cycle 2 mos ago but has had spotting for the last 2 months, is on HRT,   Preventive Screening-Counseling & Management  Alcohol-Tobacco     Alcohol drinks/day: 0     Smoking Status: quit     Year Quit: 1985  Caffeine-Diet-Exercise     Caffeine use/day: sodas,coffee,     Does Patient Exercise: no  Safety-Violence-Falls     Seat Belt Use: yes   Updated Prior Medication List: EPIPEN 2-PAK 0.3 MG/0.3ML (1:1000)  DEVI (EPINEPHRINE HCL (ANAPHYLAXIS)) Use as needed ELESTRIN 0.52 MG/0.87 GM (0.06%) GEL (ESTRADIOL) once a day PROMETRIUM 100 MG CAPS (PROGESTERONE MICRONIZED) 1 by mouth once daily  Current Allergies (reviewed today): ! 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 Dr Newell Coral (last eval MRI within last 6 months).  ADHD  Review of Systems       wt down 10# from May 2011. has 2 cats, have brought in  multiple live and dad animals, no recent vet w/u, no new soaps or detergents, has been drinkning 1 tsp of apple cider vinegar with 1 glass of water. tick bite 2 months ago, has left a sore on R mid-abd. no lymphadenopathy. has  noted cracking of her L angle of her mouth   Vital Signs:  Patient profile:   55 year old female Menstrual status:  postmenopausal Height:      61.25 inches (155.57 cm) Weight:      149.12 pounds (67.78 kg) BMI:     28.05 Temp:     98.0 degrees F (36.67 degrees C) oral Pulse rate:   64 / minute BP sitting:   126 / 83  (left arm)  Vitals Entered By: Baxter Hire) (March 22, 2010 11:22 AM) CC: new patient parasitic infection Pain Assessment Patient in pain? yes     Location: lower back Intensity: 5 Type: aching Onset of pain  Constant Nutritional Status BMI of 25 - 29 = overweight Nutritional Status Detail appetite is okay per patient  Does patient need assistance? Functional Status Self care Ambulation Normal   Physical Exam  General:  well-developed, well-nourished, and well-hydrated.  well-developed, well-nourished, and well-hydrated.   Eyes:  pupils equal, pupils round, and pupils reactive to light.  pupils equal, pupils round, and pupils reactive to light.   Mouth:  good dentition, pharynx pink and moist,  and no exudates.  good dentition, pharynx pink and moist, and no exudates.   Neck:  no masses.  no masses.   Lungs:  normal respiratory effort and normal breath sounds.  normal respiratory effort and normal breath sounds.   Heart:  normal rate, regular rhythm, and no murmur.  normal rate, regular rhythm, and no murmur.   Abdomen:  soft, non-tender, and normal bowel sounds.  soft, non-tender, and normal bowel sounds.   Extremities:  no edema Skin:  turgor normal, color normal, and no rashes.  few scatttered linear scratches, healed scabs.    Impression & Recommendations:  Problem # 1:  DISTURBANCE OF SKIN SENSATION (ICD-782.0)  I can  see no evidence of infestation from vermin on her skin. have asked that she have her cats taken to the vet. she asks about lyme dz- i told her it is not endemic to this area, her rash is not consistent with this. her prev Thyroid and GLc tests have been negative. WIll screen her for "fungal" diseases but have a very low suspicion. Have asked her to f/u with Dr Terri Piedra as well.  will f/u as needed. can phone for results of labs.   Orders: Consultation Level IV [phone]) T- * Misc. Laboratory test [phone])

## 2010-10-24 NOTE — Procedures (Signed)
Summary: Colonoscopy Report/[facility]  Colonoscopy Report/[facility]   Imported By: Maryln Gottron 04/19/2010 09:50:06  _____________________________________________________________________  External Attachment:    Type:   Image     Comment:   External Document

## 2010-10-24 NOTE — Progress Notes (Signed)
Summary: REQ FOR REFERRAL?  Phone Note Call from Patient   Caller: Patient  [phone] Reason for Call: Talk to Nurse, Talk to Doctor Summary of Call: Pt called to speak with Carollee Herter, CMA.... Pt adv that she is still exp numbness / tingling in her hands, stomach cramps, and nausea...Marland KitchenMarland Kitchen Pt adv that she believes she has a parasitic infection and wants to be referred to a physician that deals with parasitic infections ( infectious disease specialist).Marland KitchenMarland KitchenMarland KitchenPt req a return call, she can be reached at [phone].  Initial call taken by: Debbra Riding,  February 28, 2010 8:57 AM  Follow-up for Phone Call        Spoke to pt and she is very upset. She is crying about this. She keeps saying is not psychotic. Her appt with the allergist is on Wed. She wants to see someone about a parasitic infection. She states that her marriage is falling apart and her job is on the line.  Follow-up by: Romualdo Bolk, CMA (AAMA),  February 28, 2010 11:50 AM  Additional Follow-up for Phone Call Additional follow up Details #1::        Per Dr. Fabian Sharp- Go ahead and do a O and P x 2 and go ahead and do the referral to infectious disease. Order in EMR for lab test and order sent to pcc. Pt aware of this. Additional Follow-up by: Romualdo Bolk, CMA Duncan Dull),  February 28, 2010 12:50 PM    Additional Follow-up for Phone Call Additional follow up Details #2::    Pt called back again saying that she forgot to tell us that she has been in menopause x 2 years but that 2 weeks ago she started bleeding. She tried to get in with her gyn Dr. Rosalio Macadamia but she is no longer in her network for her ins. So she is going to see someone else. Follow-up by: Romualdo Bolk, CMA (AAMA),  February 28, 2010 1:54 PM

## 2010-10-24 NOTE — Medication Information (Signed)
Summary: Coverage Approval for Vyvanse  Coverage Approval for Vyvanse   Imported By: Maryln Gottron 11/21/2009 12:54:43  _____________________________________________________________________  External Attachment:    Type:   Image     Comment:   External Document

## 2010-10-24 NOTE — Progress Notes (Signed)
Summary: Needs referral to Infectious Disease, allergist or Derm  Phone Note Call from Patient Call back at Home Phone [phone]   Caller: Patient Summary of Call: Pt states that she would like a referral to Infectious Disease MD. Pt has the itching thing that has come back again. Pt has saw an allergies for this and she wants to go to someone who could help her out with this. Pt thinks there is something in the house that she may be alleric to. The allergiest didn't go any testing on her. She feels like things are crawling on her and has tiny black dots on her. She has clover mits in her house. She also thinks that the cat brought something in the house that she is allergic to. So she wants to who to go infectious disease, derm or allergiest? Then can we refer. Initial call taken by: Romualdo Bolk, CMA Duncan Dull),  Feb 03, 2010 12:41 PM  Follow-up for Phone Call        Per Dr. Fabian Sharp- Recommends Derm for this. Pt aware of this and order sent to [facility]. Follow-up by: Romualdo Bolk, CMA (AAMA),  Feb 03, 2010 1:18 PM

## 2010-10-24 NOTE — Progress Notes (Signed)
Summary: wants [place] to call Dr. Juanda Chance to see if she'll take her  Phone Note Call from Patient Call back at Home Phone [phone]   Caller: Patient Summary of Call: Pt wants [place] to call Dr. Regino Schultze office to see if she can see her for a screening colonscopy. Pt has saw Dr. Larae Grooms in the past but would like a female md. Initial call taken by: Romualdo Bolk, CMA Duncan Dull),  March 22, 2010 1:56 PM     Appended Document: wants [place] to call Dr. Juanda Chance to see if she'll take her see  colonscopy normal from Dr Matthias Hughs

## 2010-10-24 NOTE — Miscellaneous (Signed)
Summary: HIPAA Restrictions  HIPAA Restrictions   Imported By: Florinda Marker 03/22/2010 14:30:42  _____________________________________________________________________  External Attachment:    Type:   Image     Comment:   External Document

## 2010-10-24 NOTE — Progress Notes (Signed)
Summary: REFILL REQUEST  Phone Note Refill Request Message from:  Patient on March 15, 2010 9:53 AM  Refills Requested: Medication #1:  VYVANSE 70 MG CAPS 1 by mouth once daily   Notes: Pt can be reached at [phone] when Rx is ready for p/u.    Initial call taken by: Debbra Riding,  March 15, 2010 9:53 AM  Follow-up for Phone Call        Pt aware that rx is ready to pick up. Follow-up by: Romualdo Bolk, CMA (AAMA),  March 15, 2010 2:08 PM    Prescriptions: VYVANSE 70 MG CAPS (LISDEXAMFETAMINE DIMESYLATE) 1 by mouth once daily  #30 x 0   Entered by:   Romualdo Bolk, CMA (AAMA)   Authorized by:   Madelin Headings MD   Signed by:   Romualdo Bolk, CMA (AAMA) on 03/15/2010   Method used:   Print then Give to Patient   RxID:   [id number]

## 2010-10-24 NOTE — Progress Notes (Signed)
Summary: swich...  Phone Note Call from Patient Call back at Home Phone [phone]   Caller: Patient Call For: Christella Hartigan Reason for Call: Talk to Nurse Summary of Call: Patient wants to switch from Dr Christella Hartigan to Dr Juanda Chance because she rather see a women Dr. Initial call taken by: Tawni Levy,  October 20, 2009 11:35 AM  Follow-up for Phone Call        Verlee Monte, this is ok with me if you are OK with it.  Note that she has no-showed or cancelled several times for me. Follow-up by: Rachael Fee MD,  October 20, 2009 11:49 AM  Additional Follow-up for Phone Call Additional follow up Details #1::        I am sorry I will not be able to accept this pt due to my practice being full. Additional Follow-up by: Hart Carwin MD,  October 20, 2009 1:21 PM    Additional Follow-up for Phone Call Additional follow up Details #2::    patty, please let her know.  I am happy to take care of her.  will want rov before scheduling procedure. Follow-up by: Rachael Fee MD,  October 20, 2009 1:27 PM  Additional Follow-up for Phone Call Additional follow up Details #3:: Details for Additional Follow-up Action Taken: left message on machine to call back Chales Abrahams CMA Duncan Dull)  October 20, 2009 2:32 PM  pt returned call and she is unwilling to schedule with Dr Christella Hartigan.  She is going to transfer care to another practice. Chales Abrahams CMA Duncan Dull)  October 20, 2009 2:40 PM    Appended Document: swich... ok, that is too bad

## 2010-10-26 NOTE — Progress Notes (Signed)
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 She is going to call back to schedule an appt for next month and would like another refill. Initial call taken by: Romualdo Bolk, CMA (AAMA),  September 28, 2010 12:39 PM    Prescriptions: VYVANSE 70 MG CAPS (LISDEXAMFETAMINE DIMESYLATE) 1 by mouth once daily  #30 x 0   Entered by:   Romualdo Bolk, CMA (AAMA)   Authorized by:   Madelin Headings MD   Signed by:   Romualdo Bolk, CMA (AAMA) on 09/28/2010   Method used:   Print then Give to Patient   RxID:   [id number]

## 2010-11-06 ENCOUNTER — Ambulatory Visit: Payer: Self-pay

## 2010-11-06 ENCOUNTER — Telehealth: Payer: Self-pay | Admitting: *Deleted

## 2010-11-06 MED ORDER — LISDEXAMFETAMINE DIMESYLATE 70 MG PO CAPS: 70.0000 mg | ORAL_CAPSULE | ORAL | Status: DC

## 2010-11-06 NOTE — Telephone Encounter (Signed)
Pt to make a follow up appt when she picks up rx.

## 2010-11-13 ENCOUNTER — Ambulatory Visit: Payer: Self-pay

## 2010-11-20 ENCOUNTER — Ambulatory Visit: Payer: Self-pay

## 2010-11-25 ENCOUNTER — Encounter: Payer: Self-pay | Admitting: *Deleted

## 2010-11-25 ENCOUNTER — Encounter: Payer: Self-pay | Admitting: Internal Medicine

## 2010-12-04 ENCOUNTER — Ambulatory Visit: Payer: Self-pay | Admitting: Internal Medicine

## 2010-12-04 ENCOUNTER — Ambulatory Visit: Payer: Self-pay

## 2010-12-10 LAB — BASIC METABOLIC PANEL
BUN: 13 mg/dL (ref 6–23)
CO2: 28 mEq/L (ref 19–32)
Chloride: 104 mEq/L (ref 96–112)
Creatinine, Ser: 0.79 mg/dL (ref 0.4–1.2)
GFR calc Af Amer: >60 mL/min (ref >60)
Potassium: 3.6 mEq/L (ref 3.5–5.1)

## 2010-12-10 LAB — DIFFERENTIAL
Basophils Relative: 0 % (ref 0–1)
Eosinophils Absolute: 0.2 10*3/uL (ref 0.0–0.7)
Eosinophils Relative: 2 % (ref 0–5)
Monocytes Relative: 6 % (ref 3–12)
Neutrophils Relative %: 71 % (ref 43–77)

## 2010-12-10 LAB — CBC
HCT: 38.5 % (ref 36.0–46.0)
MCHC: 33.9 g/dL (ref 30.0–36.0)
MCV: 92.3 fL (ref 78.0–100.0)
RBC: 4.17 MIL/uL (ref 3.87–5.11)

## 2010-12-11 ENCOUNTER — Ambulatory Visit: Payer: Self-pay | Admitting: Internal Medicine

## 2010-12-18 ENCOUNTER — Encounter: Payer: Self-pay | Admitting: Internal Medicine

## 2010-12-18 ENCOUNTER — Ambulatory Visit
Admission: RE | Admit: 2010-12-18 | Discharge: 2010-12-18 | Disposition: A | Discharge status: Home / Self Care | Payer: Self-pay | Source: Ambulatory Visit | Attending: Internal Medicine | Admitting: Internal Medicine

## 2010-12-18 ENCOUNTER — Ambulatory Visit (INDEPENDENT_AMBULATORY_CARE_PROVIDER_SITE_OTHER): Payer: Self-pay | Admitting: Internal Medicine

## 2010-12-18 VITALS — BP 110/70 | HR 60 | Wt 160.0 lb

## 2010-12-18 DIAGNOSIS — Z1231 Encounter for screening mammogram for malignant neoplasm of breast: Secondary | ICD-10-CM

## 2010-12-18 DIAGNOSIS — F909 Attention-deficit hyperactivity disorder, unspecified type: Secondary | ICD-10-CM

## 2010-12-18 MED ORDER — LISDEXAMFETAMINE DIMESYLATE 70 MG PO CAPS: 70.0000 mg | ORAL_CAPSULE | ORAL | Status: DC

## 2010-12-18 NOTE — Progress Notes (Signed)
  Subjective:    Patient ID: Margaret Oconnor, female    DOB: Jan 30, 1956, 55 y.o.   MRN: [id number]  HPI  patient comes in for follow up of medication. She is currently on Diovan 70 mg a day and takes it most days of the week but not always on weekends. It helps her greatly get through her day and concentrate. No untoward side effects such as extra anxiety sleep issues psychoses. No chest pain shortness of breath problems.  Tried an herbal Chlorella    For cleansing and felt sick with it so stopped.  since her last visit with [place] she has had no major changes in her health status. She has followed up about the small meningioma in her right posterior fossa and no intervention needed.  She is still substance free and is now beginning to go to regular meetings.  Review of Systems  see history of present illness stop hormone replacement therapy because her mom and her aunt both had breast cancer. Occasional hot flashes.     Objective:   Physical Exam  Well-developed well-nourished in no acute distress. [place] heent grossly nl  Neck without masses or bruit Chest:  Clear to A&P without wheezes rales or rhonchi CV:  S1-S2 no gallops or murmurs peripheral perfusion is normal Abdomen:  Sof,t normal bowel sounds without hepatosplenomegaly, no guarding rebound or masses no CVA tenderness No clubbing cyanosis or edema  Neuro non focal  No tremor or jitteriness Psych Oriented x 3 and no noted deficits in memory, , and speech.        Assessment & Plan:  ADHD  Medication check  Seems to be doing well on current dose without sig se.    Hx Subs abuse   Doing well   Subs free    Refill meds and return in 6 months for med check and PV pap   Her gyne is retiring.( Dr Rosalio Macadamia)

## 2011-02-06 NOTE — Op Note (Signed)
[name], Margaret Oconnor              ACCOUNT NO.:  [id number]   MEDICAL RECORD NO.:  [id number]          PATIENT TYPE:  AMB   LOCATION:  [facility]                         FACILITY:  [facility]   PHYSICIAN:  Wilmon Arms. Corliss Skains, M.D. DATE OF BIRTH:  [birth year]   DATE OF PROCEDURE:  05/23/2007  DATE OF DISCHARGE:                               OPERATIVE REPORT   PREOPERATIVE DIAGNOSES:  Palpable right breast mass.   POSTOPERATIVE DIAGNOSIS:  Palpable right breast mass.   PROCEDURE PERFORMED:  Right breast excisional biopsy.   SURGEON:  Wilmon Arms. Corliss Skains, M.D., FACS   ANESTHESIA:  General.   INDICATIONS:  The patient is a 55 year old female who presents with a  palpable mass in her right lower outer quadrant.  This was examined with  mammogram which was unremarkable.  Ultrasound showed some thickening  showing fibroglandular tissue with no distortion or mass effect.  The  patient had a previous biopsy in the same area back in 1989 which was  benign.  She has a family history of breast cancer in her mother.  Due  to this level of concern, she requests excisional biopsy.   DESCRIPTION OF PROCEDURE:  The patient was brought to the operating  room, placed in the supine position on the operating room table.  After  an adequate level of general anesthesia was obtained, the area around  the palpable mass was infiltrated with 0.25% Marcaine.  This seemed to  be immediately adjacent to her previous biopsy incision.  Her previous  biopsy incision was opened with a scalpel.  Skin flaps were raised in  all directions.  The mass was grasped with an Allis clamp.  Cautery was  used to dissect around the mass.  There did not seem to be a distinct  mass but rather an area of what appeared to be scar tissue.  The  specimen was removed and oriented with a long suture lateral and a short  suture superior.  I re-explored the biopsy cavity.  There seemed to be  some residual firm tissue at the base of the wound at the  deep margin.  This area was grasped with an Allis clamp and was taken as a separate  specimen marked posterior margin.  A suture was placed on the old deep  margin.  The wound was then irrigated with saline.  Hemostasis was good.  The wound was closed with a deep layer of 3-0 Vicryl and a subcuticular  layer of 4-0 Monocryl.  Steri-Strips and clean dressings were applied.  The patient was then extubated and brought to recovery in stable  condition.  All sponge, instrument, and needle counts were correct.      Wilmon Arms. Tsuei, M.D.  Electronically Signed     MKT/MEDQ  D:  05/23/2007  T:  05/24/2007  Job:  [id number]

## 2011-02-09 NOTE — Assessment & Plan Note (Signed)
[facility]                                 ON-CALL NOTE   AREAL, COCHRANE                     MRN:          [id number]  DATE:10/26/2006                            DOB:          Sep 03, 1956    TIME:  Time of interaction at 11:30 in the morning.   TELEPHONE NUMBER:  The phone number [phone].   OBJECTIVE:  The patient thinks she has a urinary infection.  She was  offered an appointment to be seen, but declined.  She stated she would  call back if she wants an appointment.   The patient did not call back by 2:55 when we closed the clinic.   PRIMARY CARE PHYSICIAN:  The patient's primary care Christeen Lai is Dr.  Fabian Sharp whose home office is in [place] .     Arta Silence, MD  Electronically Signed    RNS/MedQ  DD: 10/26/2006  DT: 10/26/2006  Job #: [id number]

## 2011-02-09 NOTE — H&P (Signed)
[facility]  Patient:    Margaret Oconnor, Margaret Oconnor                    MRN: [id number] Adm. Date:  03/06/01 Attending:  Evette Georges, M.D. [facility] CC:         Neta Mends. Panosh, M.D. [facility]   History and Physical  DATE OF BIRTH:  Apr 25, 1956.  SOCIAL SECURITY NUMBER:  [id number]  PRIMARY CARE PHYSICIAN:  Neta Mends. Panosh, M.D.  ADMITTING PHYSICIAN:  Evette Georges, M.D.  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  The patient states she was well until this afternoon when she was running at [facility] through the wood.  She got bit on the right hand by an insect; she does not recall seeing what it was.  About 15 minutes later she developed acute itching and hives.  Because she was close to the office, she drove to the office instead of trying to go to the hospital.  When she came in the door she was pale, she was tremulous, and she had total body hives.  She was immediately taken back to an exam room and Vernona Rieger triaged her and called me stat.  When I saw her she had acute total body hives and she was unable to converse clearly.  She was placed in a supine position and blood pressure was 80/0.  She was given 0.15 epinephrine subcutaneous stat, IV was started stat, she was given 100 mg of Solu-Medrol IV stat, and within 10 minutes her blood pressure came up to 110/70 and she began to feel better.  She still had complaints of swelling in her throat and tightness in her chest.  The patient has never had any problems like this in the past.  She has never had any history of allergic rhinitis, asthma, or previous insect sting reactions.  PAST MEDICAL HISTORY:  Hospitalized for child birth x 3.  PAST ILLNESSES:  None.  PAST INJURIES:  None.  ALLERGIES:  No  known drug allergies.  CURRENT MEDICATIONS:  Wellbutrin 150 SR b.i.d.  SOCIAL HISTORY:  She does not smoke or drink any alcohol.  For birth control her husband has had a vasectomy.  She is married, she has three children, and she is an Charity fundraiser by trade.  She works at [employer] at [employer].  REVIEW OF SYSTEMS:  Otherwise negative.  FAMILY HISTORY:  Negative for allergic rhinitis, asthma, and anaphylaxis.  PHYSICAL EXAMINATION:  VITAL SIGNS:  Initially blood pressure 80/0, palpable, now blood pressure 110/70.  Pulse is 70 and regular, temperature 98.  Her weight is approximately 140 pounds.  GENERAL:  She is a well-developed, well-nourished, white female, now conversant.  She has total body hives.  She is complaining of swelling of her throat, although she is able to talk normally.  She also complains of tightness in her chest.  HEENT:  Negative, except eyes are swollen.  Examination of oral cavity was negative, except uvula is markedly swollen three times normal size.  NECK:  Supple, no adenopathy.  LUNGS:  Clear to auscultation.  HEART:  Negative.  ABDOMEN:  Negative.  EXTREMITIES:  Hives throughout all four extremities and trunk.  IMPRESSION:  Acute anaphylactic reaction.  PLAN:  Admit to telemetry monitoring unit.  Will keep on IV steroids, IV fluids, and monitor.  She will be given Benadryl as needed for itching.  Once she is stable she will be discharged.  Upon discharge she should see Dr. Linton Rump Ward or allergist of her choice, if she chooses someone else, for complete allergic evaluation.  She was also instructed in the future that she needed to keep an Ana-Kit with her where ever she goes. DD:  03/06/01 TD:  03/06/01 Job: [id number]

## 2011-02-12 ENCOUNTER — Telehealth: Payer: Self-pay | Admitting: *Deleted

## 2011-02-12 MED ORDER — LISDEXAMFETAMINE DIMESYLATE 70 MG PO CAPS: 70.0000 mg | ORAL_CAPSULE | ORAL | Status: DC

## 2011-02-12 NOTE — Telephone Encounter (Signed)
Pt needs a refill on vyvanse. Pt is due for a ROV in Sept.

## 2011-02-14 ENCOUNTER — Ambulatory Visit (INDEPENDENT_AMBULATORY_CARE_PROVIDER_SITE_OTHER): Payer: PRIVATE HEALTH INSURANCE | Admitting: Family Medicine

## 2011-02-14 ENCOUNTER — Encounter: Payer: Self-pay | Admitting: Family Medicine

## 2011-02-14 VITALS — BP 122/84 | Temp 98.0°F

## 2011-02-14 DIAGNOSIS — M25569 Pain in unspecified knee: Secondary | ICD-10-CM

## 2011-02-14 DIAGNOSIS — M26609 Unspecified temporomandibular joint disorder, unspecified side: Secondary | ICD-10-CM

## 2011-02-14 DIAGNOSIS — J069 Acute upper respiratory infection, unspecified: Secondary | ICD-10-CM

## 2011-02-14 MED ORDER — DOXYCYCLINE HYCLATE 100 MG PO CAPS: 100.0000 mg | ORAL_CAPSULE | Freq: Two times a day (BID) | ORAL | Status: AC

## 2011-02-14 MED ORDER — ETODOLAC 500 MG PO TABS: 500.0000 mg | ORAL_TABLET | Freq: Two times a day (BID) | ORAL | Status: AC

## 2011-02-14 NOTE — Progress Notes (Signed)
  Subjective:    Patient ID: Margaret Oconnor, female    DOB: January 04, 1956, 55 y.o.   MRN: [id number]  HPI Here with 2 complaints. First for several weeks she has had pain in both ears, left being worse than right. Some sinus pressure, PND, and ST. No fever or cough. Sometimes chewing causes her ears to hurt. Also for 2 weeks she has had pain in the right knee. No recent trauma. It does not swell or lock up or give way. The pain is a sharp pain deep inside the knee. It comes and goes. Advil helps a little.    Review of Systems  Constitutional: Negative.   HENT: Positive for ear pain, congestion, postnasal drip and sinus pressure. Negative for hearing loss, sore throat and tinnitus.   Eyes: Negative.   Respiratory: Negative.   Musculoskeletal: Positive for arthralgias.       Objective:   Physical Exam  Constitutional: She appears well-developed and well-nourished.  HENT:  Head: Normocephalic and atraumatic.  Right Ear: External ear normal.  Left Ear: External ear normal.  Nose: Nose normal.  Mouth/Throat: Oropharynx is clear and moist. No oropharyngeal exudate.       Both TMJ joints are tender with some crepitus  Eyes: Conjunctivae are normal. Pupils are equal, round, and reactive to light.  Neck: Normal range of motion. Neck supple. No thyromegaly present.  Pulmonary/Chest: Effort normal and breath sounds normal.  Musculoskeletal:       The right knee has a completely normal exam with no swelling or tenderness, full ROM   Lymphadenopathy:    She has no cervical adenopathy.          Assessment & Plan:  She has a URI. She has some TMJ pain. And this is what she is experiencing as ear pain. Etodolac should help this. The knee pain is consistent with tendonitis, so I advised rest and wearing a support sleeve. Etodolac should reduce inflammation.

## 2011-03-22 ENCOUNTER — Other Ambulatory Visit: Payer: Self-pay | Admitting: Internal Medicine

## 2011-03-22 NOTE — Telephone Encounter (Signed)
Pt called req refill of Vyvance 70 mg. Pt req to pick script on Friday. Pt also is req call back from [place].

## 2011-03-23 MED ORDER — LISDEXAMFETAMINE DIMESYLATE 70 MG PO CAPS: 70.0000 mg | ORAL_CAPSULE | ORAL | Status: DC

## 2011-03-23 NOTE — Telephone Encounter (Signed)
Spoke to pt and she just needed a refill on vyvanse. I told pt that it would be ready this afternoon. Pt is due in Sept for a rov

## 2011-04-17 ENCOUNTER — Ambulatory Visit (INDEPENDENT_AMBULATORY_CARE_PROVIDER_SITE_OTHER): Payer: PRIVATE HEALTH INSURANCE | Admitting: Internal Medicine

## 2011-04-19 ENCOUNTER — Ambulatory Visit: Payer: PRIVATE HEALTH INSURANCE | Admitting: Internal Medicine

## 2011-05-16 ENCOUNTER — Telehealth: Payer: Self-pay | Admitting: *Deleted

## 2011-05-16 MED ORDER — LISDEXAMFETAMINE DIMESYLATE 70 MG PO CAPS: 70.0000 mg | ORAL_CAPSULE | ORAL | Status: DC

## 2011-05-16 NOTE — Telephone Encounter (Signed)
Pt needs a refill on vyvanse 70mg  Pt is due for a follow up on meds in Sept.  Left message on machine about this.

## 2011-07-06 LAB — BASIC METABOLIC PANEL
BUN: 9
CO2: 26
Calcium: 9.3
Chloride: 105
Creatinine, Ser: 1.01
GFR calc Af Amer: >60

## 2011-07-06 LAB — DIFFERENTIAL
Basophils Absolute: 0
Basophils Relative: 1
Eosinophils Absolute: 0.4
Monocytes Relative: 8
Neutro Abs: 2.8
Neutrophils Relative %: 49

## 2011-07-06 LAB — POCT PREGNANCY, URINE
Operator id: 268271
Preg Test, Ur: NEGATIVE

## 2011-07-06 LAB — CBC
MCHC: 33.6
MCV: 85.7
Platelets: 300
RBC: 4.28

## 2011-10-31 ENCOUNTER — Ambulatory Visit: Payer: PRIVATE HEALTH INSURANCE | Admitting: Family Medicine

## 2011-11-01 ENCOUNTER — Other Ambulatory Visit: Payer: Self-pay | Admitting: Dermatology

## 2011-11-23 ENCOUNTER — Telehealth: Payer: Self-pay | Admitting: Internal Medicine

## 2011-11-23 MED ORDER — LISDEXAMFETAMINE DIMESYLATE 70 MG PO CAPS: 70.0000 mg | ORAL_CAPSULE | ORAL | Status: DC

## 2011-11-23 NOTE — Telephone Encounter (Signed)
Rx ready to pick up but pt needs an rov before next refiil.

## 2011-11-23 NOTE — Telephone Encounter (Signed)
VYVANSE -  needs written rx for this - please call her when ready for pick up - wants to get it today after work

## 2012-01-17 ENCOUNTER — Telehealth: Payer: Self-pay | Admitting: Internal Medicine

## 2012-01-17 NOTE — Telephone Encounter (Signed)
Pt called and rsc their appt they had for 01/18/12 to 02/05/12. Pt is req to get 1 more refill ov Vyvanse to last until appt date.

## 2012-01-18 ENCOUNTER — Ambulatory Visit: Payer: PRIVATE HEALTH INSURANCE | Admitting: Internal Medicine

## 2012-01-18 MED ORDER — LISDEXAMFETAMINE DIMESYLATE 70 MG PO CAPS: 70.0000 mg | ORAL_CAPSULE | ORAL | Status: DC

## 2012-01-18 NOTE — Telephone Encounter (Signed)
Ok x 1 30 days only .

## 2012-01-18 NOTE — Telephone Encounter (Signed)
Rx printed.  Pt aware ready for pick up.

## 2012-01-18 NOTE — Telephone Encounter (Signed)
Pt last seen 02/14/11.  Rx last filled 11/23/11.  Pls advise.

## 2012-02-05 ENCOUNTER — Ambulatory Visit: Payer: PRIVATE HEALTH INSURANCE | Admitting: Internal Medicine

## 2012-02-08 ENCOUNTER — Telehealth: Payer: Self-pay

## 2012-02-08 ENCOUNTER — Ambulatory Visit: Payer: PRIVATE HEALTH INSURANCE | Admitting: Internal Medicine

## 2012-02-08 NOTE — Telephone Encounter (Signed)
Per Dr. Fabian Sharp called pt see if pt could come on 02/15/12 or 02/22/2012. Left a message for return call.

## 2012-02-08 NOTE — Telephone Encounter (Signed)
Pt aware of appt on 02/22/12 at 1:15 pm.  Pt states this is her lunch time and pt can make it then.

## 2012-02-22 ENCOUNTER — Ambulatory Visit (INDEPENDENT_AMBULATORY_CARE_PROVIDER_SITE_OTHER): Payer: PRIVATE HEALTH INSURANCE | Admitting: Internal Medicine

## 2012-02-22 ENCOUNTER — Encounter: Payer: Self-pay | Admitting: Internal Medicine

## 2012-02-22 VITALS — BP 110/76 | HR 54 | Temp 98.2°F | Wt 160.0 lb

## 2012-02-22 DIAGNOSIS — Z5181 Encounter for therapeutic drug level monitoring: Secondary | ICD-10-CM | POA: Insufficient documentation

## 2012-02-22 DIAGNOSIS — Z79899 Other long term (current) drug therapy: Secondary | ICD-10-CM

## 2012-02-22 DIAGNOSIS — F909 Attention-deficit hyperactivity disorder, unspecified type: Secondary | ICD-10-CM

## 2012-02-22 MED ORDER — LISDEXAMFETAMINE DIMESYLATE 70 MG PO CAPS: 70.0000 mg | ORAL_CAPSULE | ORAL | Status: AC

## 2012-02-22 MED ORDER — LISDEXAMFETAMINE DIMESYLATE 70 MG PO CAPS: 70.0000 mg | ORAL_CAPSULE | ORAL | Status: DC

## 2012-02-22 NOTE — Progress Notes (Signed)
  Subjective:    Patient ID: Margaret Oconnor, female    DOB: 05/02/1956, 56 y.o.   MRN: [id number]  HPI Comes in for follow up of  medical issues ADHD  vyvanse helps  Takes before noon to help  ocass affects sleep . No sub use or etoh. No other major change in health However  A year ago changed jobs   Marriage separation difficult but coping  . No acute allergy sx.  No major illness or injury   Did have labs done lfts done about 6 months ago.  Review of Systems Outpatient Encounter Prescriptions as of 02/22/2012  Medication Sig Dispense Refill  . lisdexamfetamine (VYVANSE) 70 MG capsule Take 1 capsule (70 mg total) by mouth every morning. Fill on or after 7 30 13  30  capsule  0  . DISCONTD: lisdexamfetamine (VYVANSE) 70 MG capsule Take 1 capsule (70 mg total) by mouth every morning.  30 capsule  0  . DISCONTD: lisdexamfetamine (VYVANSE) 70 MG capsule Take 1 capsule (70 mg total) by mouth every morning.  30 capsule  0  . DISCONTD: lisdexamfetamine (VYVANSE) 70 MG capsule Take 1 capsule (70 mg total) by mouth every morning. Fill on or after 6 30 13  30  capsule  0  . EPINEPHrine (EPIPEN 2-PAK) 0.3 mg/0.3 mL DEVI Inject 0.3 mg into the muscle once.        Marland Kitchen DISCONTD: Estradiol (ELESTRIN) 0.52 MG/0.87 GM (0.06%) GEL Place onto the skin.        Marland Kitchen DISCONTD: lisdexamfetamine (VYVANSE) 70 MG capsule Take 1 capsule (70 mg total) by mouth every morning.  30 capsule  0  . DISCONTD: progesterone (PROMETRIUM) 100 MG capsule Take 100 mg by mouth daily.         Past history family history social history reviewed  And updated in the electronic medical record.     Objective:   Physical Exam  BP 110/76  Pulse 54  Temp(Src) 98.2 F (36.8 C) (Oral)  Wt 160 lb (72.576 kg)  SpO2 98% wdwn in nad heent: grossly normal eoms nl  Neck: Supple without adenopathy or masses or bruits Chest:  Clear to A&P without wheezes rales or rhonchi CV:  S1-S2 no gallops or murmurs peripheral perfusion is  normal Abdomen:  Sof,t normal bowel sounds without hepatosplenomegaly, no guarding rebound or masses no CVA tenderness No clubbing cyanosis or edema  no tremor Skin some excoriations  No rash       Assessment & Plan:  ADHD meds seem to help without sig se .   Disc using on weekends also to avoid drug swings Remission subs use Needs for PV has had gyne check Dr Juliene Pina . Should get labs monitoring PV panel tsh lipids bmp cbc etc   Call us about  scheduleing this if not already done  Allergies stable   Total visit 22 mins > 50% spent counseling and coordinating care

## 2012-02-22 NOTE — Patient Instructions (Signed)
Continue meds as directed.  Call with information about labs done and then  We can order labs to be done .   Otherwise OV in 6 months or as needed.

## 2012-12-17 ENCOUNTER — Other Ambulatory Visit: Payer: Self-pay

## 2012-12-17 DIAGNOSIS — Z1231 Encounter for screening mammogram for malignant neoplasm of breast: Secondary | ICD-10-CM

## 2013-01-02 ENCOUNTER — Ambulatory Visit: Payer: PRIVATE HEALTH INSURANCE

## 2013-01-09 ENCOUNTER — Ambulatory Visit
Admission: RE | Admit: 2013-01-09 | Discharge: 2013-01-09 | Disposition: A | Discharge status: Home / Self Care | Payer: BC Managed Care – PPO | Source: Ambulatory Visit

## 2013-01-09 DIAGNOSIS — Z1231 Encounter for screening mammogram for malignant neoplasm of breast: Secondary | ICD-10-CM

## 2013-01-26 ENCOUNTER — Telehealth: Payer: Self-pay | Admitting: Family Medicine

## 2013-01-26 NOTE — Telephone Encounter (Signed)
Received a request from [facility].  The patient is asking for a refill of her epi pen.  I do not see where you prescribed this.  Not seen since 02/22/12.  Please advise.  Thanks!!

## 2013-01-26 NOTE — Telephone Encounter (Signed)
Ok to refill x 1   Need to know if she used it or it expired  Needs yearly visit  This month

## 2013-01-27 ENCOUNTER — Other Ambulatory Visit: Payer: Self-pay | Admitting: Family Medicine

## 2013-01-27 MED ORDER — EPINEPHRINE 0.3 MG/0.3ML IJ SOAJ: 0.3000 mg | Freq: Once | INTRAMUSCULAR | Status: AC

## 2013-01-27 NOTE — Telephone Encounter (Signed)
Pt says she just started a new job and would not commit to an appt. Pt did have some health concerns and asked if we did an ekg.  Pt said she likes to have the epi pen around in case she needs.  Pt promised she would call and schedule her appt.

## 2013-01-27 NOTE — Telephone Encounter (Signed)
For your review.  Close encounter when you are through.

## 2013-01-27 NOTE — Telephone Encounter (Signed)
I sent this to the pharmacy.  Please schedule this patient for a yearly check up per [facility] (see below).  Thanks!!

## 2013-01-28 NOTE — Telephone Encounter (Signed)
Acknowledged  . Ok.   Last ekg 2010.

## 2013-02-03 ENCOUNTER — Emergency Department (HOSPITAL_COMMUNITY)
Admission: EM | Admit: 2013-02-03 | Discharge: 2013-02-03 | Disposition: A | Discharge status: Home / Self Care | Payer: BC Managed Care – PPO | Source: Home / Self Care

## 2013-02-03 ENCOUNTER — Encounter (HOSPITAL_COMMUNITY): Payer: Self-pay

## 2013-02-03 DIAGNOSIS — R0600 Dyspnea, unspecified: Secondary | ICD-10-CM

## 2013-02-03 DIAGNOSIS — R079 Chest pain, unspecified: Secondary | ICD-10-CM

## 2013-02-03 DIAGNOSIS — R0989 Other specified symptoms and signs involving the circulatory and respiratory systems: Secondary | ICD-10-CM

## 2013-02-03 NOTE — ED Notes (Signed)
Episodic pain left chest, left arm and cough for past 2-3 weeks; NAD at present

## 2013-02-03 NOTE — ED Notes (Addendum)
Patient was encouraged to allow transfer to main ED for further evaluation of CP syx, but declines. Signed out AMA

## 2013-02-03 NOTE — ED Provider Notes (Signed)
History     CSN: [id number]  Arrival date & time 02/03/13  1623   None     Chief Complaint  Patient presents with  . Chest Pain    (Consider location/radiation/quality/duration/timing/severity/associated sxs/prior treatment) HPI Comments: 57 year old female presents with a 3 week history of left-sided chest pain described as a pressure feeling associated with left arm pain, shortness of breath and occasional nausea. The symptoms occur sporadically several times during the day. They occur both at rest and upon exertion. It is associated with a cough. Her shortness of breath has been increasing over the past couple of months as well as her decreased  Endurance.  She has a history of anxiety and depression for which she is on several psychotropics.    Past Medical History  Diagnosis Date  . IBS (irritable bowel syndrome)   . Substance addiction     recovering  . Anaphylaxis 2002    due to insect stings  . Hx of stress fx 2002    r tibia  . Allergy   . Meningioma     sacn stable no signs 1 cm rt posterior fossa 2003 dx when eval for right sided numbness  . ADHD (attention deficit hyperactivity disorder)   . Leg length discrepancy   . Benign meningioma     Nudelman 1 cm r post fossa stable    Past Surgical History  Procedure Laterality Date  . Stress fracture  11/2000    rt tibia    Family History  Problem Relation Age of Onset  . Breast cancer Mother   . Hodgkin's lymphoma Father     History  Substance Use Topics  . Smoking status: Former Games developer  . Smokeless tobacco: Not on file  . Alcohol Use: No    OB History   Grav Para Term Preterm Abortions TAB SAB Ect Mult Living   6 3              Review of Systems  Constitutional: Positive for activity change. Negative for fever and unexpected weight change.  HENT: Negative.   Respiratory: Positive for chest tightness, shortness of breath and wheezing.   Cardiovascular: Positive for chest pain. Negative for  palpitations and leg swelling.  Gastrointestinal: Positive for nausea.  Genitourinary: Negative.   Musculoskeletal: Negative.   Neurological: Negative.   Psychiatric/Behavioral: Positive for agitation. The patient is nervous/anxious.     Allergies  Review of patient's allergies indicates no known allergies.  Home Medications   Current Outpatient Rx  Name  Route  Sig  Dispense  Refill  . buPROPion (WELLBUTRIN XL) 150 MG 24 hr tablet   Oral   Take 150 mg by mouth daily.         Marland Kitchen EPINEPHrine (EPI-PEN) 0.3 mg/0.3 mL DEVI   Intramuscular   Inject 0.3 mLs (0.3 mg total) into the muscle once.   2 Device   0   . lisdexamfetamine (VYVANSE) 70 MG capsule   Oral   Take 1 capsule (70 mg total) by mouth every morning. Fill on or after 7 30 13   30  capsule   0   . progesterone (PROMETRIUM) 100 MG capsule   Oral   Take 100 mg by mouth daily.         . sertraline (ZOLOFT) 100 MG tablet   Oral   Take 100 mg by mouth daily.         . traZODone (DESYREL) 100 MG tablet   Oral   Take 100  mg by mouth at bedtime.           BP 129/80  Pulse 54  Temp(Src) 97.9 F (36.6 C) (Oral)  SpO2 97%  Physical Exam  Nursing note and vitals reviewed. Constitutional: She is oriented to person, place, and time. She appears well-developed and well-nourished. No distress.  Eyes: Conjunctivae and EOM are normal.  Neck: Normal range of motion. Neck supple.  Cardiovascular: Normal rate, regular rhythm, normal heart sounds and intact distal pulses.   No murmur heard. Heart rate noted at 54.  Pulmonary/Chest: Effort normal and breath sounds normal. No respiratory distress. She has no wheezes. She has no rales.  There is a sharp tenderness along the left sternal border. This sharp tenderness is different from the pain for which she presents.  Abdominal: Soft. There is no tenderness.  Musculoskeletal: Normal range of motion. She exhibits no edema.  Lymphadenopathy:    She has no cervical  adenopathy.  Neurological: She is alert and oriented to person, place, and time. No cranial nerve deficit. She exhibits normal muscle tone.  Skin: Skin is warm and dry. No rash noted. No erythema.  Psychiatric: She has a normal mood and affect.    ED Course  Procedures (including critical care time)  Labs Reviewed - No data to display No results found.   1. Chest pain   2. Dyspnea       MDM  Although the  EKG s normal. Her history is of concern as she has pressure in the left chest that radiates into the left arm sometimes associated with nausea. She has also had increasing shortness of breath over the past 2-3 months.recommendation is for her to be transferred to the emergency department for chest pain workup. It was explained to the patient that this could possibly be a blood clot, heart disease, angina or something serious. The patient has refused to go to the emergency department for evaluation despite knowledge and understanding of this information. She is going to sign AMA.         Hayden Rasmussen, NP 02/03/13 1750  Hayden Rasmussen, NP 02/03/13 2020

## 2013-02-06 NOTE — ED Provider Notes (Signed)
Medical screening examination/treatment/procedure(s) were performed by resident physician or non-physician practitioner and as supervising physician I was immediately available for consultation/collaboration.   KINDL,JAMES DOUGLAS MD.   James D Kindl, MD 02/06/13 1001 

## 2013-02-09 ENCOUNTER — Ambulatory Visit: Payer: BC Managed Care – PPO | Admitting: Internal Medicine

## 2013-09-02 ENCOUNTER — Telehealth: Payer: Self-pay | Admitting: Internal Medicine

## 2013-09-02 NOTE — Telephone Encounter (Signed)
Last seen 02/22/2012

## 2013-09-02 NOTE — Telephone Encounter (Signed)
We can do referral but since i hanven seen her for this problem they may not except referral. 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   [facility]

## 2013-09-02 NOTE — Telephone Encounter (Signed)
Pt states she has been having frequent daily headaches and would like referral to neurologist. pls advise

## 2013-09-03 ENCOUNTER — Other Ambulatory Visit: Payer: Self-pay | Admitting: Family Medicine

## 2013-09-03 NOTE — Telephone Encounter (Signed)
Spoke to the pt.  She stated she has had the headaches for several weeks and they are getting worse.  She is taking Excedrin with no relief.  The headaches are waking her in the middle of the night.  She has nausea.  Informed her I have placed the order in the system.

## 2013-09-08 ENCOUNTER — Telehealth: Payer: Self-pay | Admitting: Internal Medicine

## 2013-09-08 NOTE — Telephone Encounter (Signed)
Patient notified that Dr. Fabian Sharp will have to approve the MRI.  She wants this before she sees neuro.  She will also call the neuro office to see if they will order it.

## 2013-09-08 NOTE — Telephone Encounter (Signed)
Pt would like an mri prior to her 10/02/13 appt at the neuro md.  pls advise.pt aware dr Fabian Sharp out until Monday 12/22. Pt would like the mri before dr Fabian Sharp gets back.

## 2013-09-10 NOTE — Telephone Encounter (Signed)
Would need OV to be able to consider ordering MRI.

## 2013-09-10 NOTE — Telephone Encounter (Signed)
Please schedule appt if necessary.  Thanks!

## 2013-09-22 NOTE — Telephone Encounter (Signed)
Pt will call and sched an appt later

## 2013-10-02 ENCOUNTER — Ambulatory Visit: Payer: BC Managed Care – PPO | Admitting: Neurology

## 2013-12-30 ENCOUNTER — Telehealth: Payer: Self-pay | Admitting: Internal Medicine

## 2013-12-30 NOTE — Telephone Encounter (Signed)
Pt only day off is Monday 01/04/14 and thinks she has an infection in her leg can I use one of the SDA  slots

## 2013-12-30 NOTE — Telephone Encounter (Signed)
Please call patient to see what kind of infection is going on ( she is a Designer, jewellery )  Triage and get info so we can best help. And decide on where to put her on the schedule   thanks

## 2013-12-30 NOTE — Telephone Encounter (Signed)
This is not my pt. Message forwarded to [facility]

## 2013-12-31 NOTE — Telephone Encounter (Signed)
Pt returned my call and left a message.  I called her back and received voicemail.  Left a message.

## 2013-12-31 NOTE — Telephone Encounter (Signed)
Left a message at the below listed number for the pt to return my call. 

## 2014-01-01 NOTE — Telephone Encounter (Signed)
Left a message at the below listed number for the pt to return my call. 

## 2014-01-04 NOTE — Telephone Encounter (Signed)
Left a message at the below listed number for the pt to return my call. Will now send a contact letter.

## 2014-03-23 ENCOUNTER — Telehealth: Payer: Self-pay | Admitting: Internal Medicine

## 2014-03-23 NOTE — Telephone Encounter (Signed)
Spoke to the pt.  She preferred to come in and see [facility] for guidance.  Willing to go to the ED if [facility] determines it is necessary. Appt set for 03/24/2014 @ 3pm.  Okay per [facility].

## 2014-03-23 NOTE — Telephone Encounter (Signed)
Patient Information:  Caller Name: Trenesha  Phone: [phone]  Patient: Margaret Oconnor, Margaret Oconnor  Gender: Female  DOB: Oct 06, 1955  Age: 58 Years  PCP: [facility])  Office Follow Up:  Does the office need to follow up with this patient?: No  Instructions For The Office: N/A  RN Note:  Pt is very hesitant to go to the ED as advised.  In fact, after calling the office to give heads up, she had to get off the phone "to see a pt".  Asked that I call her back in 15-30 minutes.  Symptoms  Reason For Call & Symptoms: Onset 1 month of right lower chest/upper abdomen radiating to back and shoulder.  Rates pain 8/10 now but varies in intensity. Also c/o nausea and bloating.  Reviewed Health History In EMR: Yes  Reviewed Medications In EMR: Yes  Reviewed Allergies In EMR: Yes  Reviewed Surgeries / Procedures: Yes  Date of Onset of Symptoms: 02/20/2014  Treatments Tried: Tramadol 50 mg tablet  last dose 2 weeks ago  Treatments Tried Worked: Yes  Guideline(s) Used:  Abdominal Pain - Female  Abdominal Pain - Upper  Disposition Per Guideline:   Go to ED Now  Reason For Disposition Reached:   Severe abdominal pain (e.g., excruciating)  Advice Given:  Call Back If:  Abdominal pain is constant and present for more than 2 hours.  You become worse.  Patient Refused Recommendation:  Patient Refused Care Advice  See note in triage.

## 2014-03-23 NOTE — Telephone Encounter (Signed)
On call back to pt, she is still declining to go to the ED at this time.  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 Regis Bill today.

## 2014-03-23 NOTE — Telephone Encounter (Signed)
Please give 30 min if available  havent seen  In office her since 5 13

## 2014-03-24 ENCOUNTER — Ambulatory Visit (INDEPENDENT_AMBULATORY_CARE_PROVIDER_SITE_OTHER): Payer: BC Managed Care – PPO | Admitting: Internal Medicine

## 2014-03-24 ENCOUNTER — Encounter: Payer: Self-pay | Admitting: Internal Medicine

## 2014-03-24 VITALS — BP 134/100 | Temp 97.9°F | Ht 61.0 in

## 2014-03-24 DIAGNOSIS — R1011 Right upper quadrant pain: Secondary | ICD-10-CM

## 2014-03-24 LAB — CBC WITH DIFFERENTIAL/PLATELET
BASOS ABS: 0 10*3/uL (ref 0.0–0.1)
Basophils Relative: 0.6 % (ref 0.0–3.0)
Eosinophils Absolute: 0.1 10*3/uL (ref 0.0–0.7)
Eosinophils Relative: 1.4 % (ref 0.0–5.0)
HEMATOCRIT: 44.6 % (ref 36.0–46.0)
Hemoglobin: 14.9 g/dL (ref 12.0–15.0)
LYMPHS ABS: 2.3 10*3/uL (ref 0.7–4.0)
Lymphocytes Relative: 28.1 % (ref 12.0–46.0)
MCHC: 33.5 g/dL (ref 30.0–36.0)
MCV: 94.7 fl (ref 78.0–100.0)
MONO ABS: 0.6 10*3/uL (ref 0.1–1.0)
Monocytes Relative: 6.8 % (ref 3.0–12.0)
Neutro Abs: 5.3 10*3/uL (ref 1.4–7.7)
Neutrophils Relative %: 63.1 % (ref 43.0–77.0)
PLATELETS: 311 10*3/uL (ref 150.0–400.0)
RBC: 4.71 Mil/uL (ref 3.87–5.11)
RDW: 14.3 % (ref 11.5–15.5)
WBC: 8.3 10*3/uL (ref 4.0–10.5)

## 2014-03-24 LAB — BASIC METABOLIC PANEL
BUN: 14 mg/dL (ref 6–23)
CHLORIDE: 100 meq/L (ref 96–112)
CO2: 28 meq/L (ref 19–32)
Calcium: 9.8 mg/dL (ref 8.4–10.5)
Creatinine, Ser: 0.8 mg/dL (ref 0.4–1.2)
GFR: 78.09 mL/min (ref >60.00)
GLUCOSE: 82 mg/dL (ref 70–99)
POTASSIUM: 3.8 meq/L (ref 3.5–5.1)
Sodium: 136 mEq/L (ref 135–145)

## 2014-03-24 LAB — POCT URINALYSIS DIPSTICK
Bilirubin, UA: NEGATIVE
Blood, UA: NEGATIVE
GLUCOSE UA: NEGATIVE
NITRITE UA: NEGATIVE
Protein, UA: NEGATIVE
SPEC GRAV UA: 1.015
UROBILINOGEN UA: 0.2
pH, UA: 5.5

## 2014-03-24 LAB — LIPID PANEL
Cholesterol: 260 mg/dL — ABNORMAL HIGH (ref 0–200)
HDL: 125.1 mg/dL (ref >39.00)
LDL Cholesterol: 114 mg/dL — ABNORMAL HIGH (ref 0–99)
NONHDL: 134.9
TRIGLYCERIDES: 104 mg/dL (ref 0.0–149.0)
Total CHOL/HDL Ratio: 2
VLDL: 20.8 mg/dL (ref 0.0–40.0)

## 2014-03-24 LAB — SEDIMENTATION RATE: Sed Rate: 20 mm/hr (ref 0–22)

## 2014-03-24 LAB — HEPATIC FUNCTION PANEL
ALBUMIN: 4.2 g/dL (ref 3.5–5.2)
ALT: 13 U/L (ref 0–35)
AST: 18 U/L (ref 0–37)
Alkaline Phosphatase: 76 U/L (ref 39–117)
BILIRUBIN TOTAL: 0.7 mg/dL (ref 0.2–1.2)
Bilirubin, Direct: 0 mg/dL (ref 0.0–0.3)
Total Protein: 7.7 g/dL (ref 6.0–8.3)

## 2014-03-24 LAB — TSH: TSH: 0.4 u[IU]/mL (ref 0.35–4.50)

## 2014-03-24 LAB — T4, FREE: FREE T4: 0.86 ng/dL (ref 0.60–1.60)

## 2014-03-24 MED ORDER — TRAMADOL HCL 50 MG PO TABS: 50.0000 mg | ORAL_TABLET | Freq: Three times a day (TID) | ORAL | Status: DC | PRN

## 2014-03-24 NOTE — Progress Notes (Signed)
Chief Complaint  Patient presents with  . Abdominal Pain  . Nausea    HPI: Patient comes in today for SDA for  new problem evaluation. See phone note  Last visit was 2 year ago.  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   Onset right lateral chest/. Upper abd pain described as colicky but persistent for about 4-6 weeks comes and goes . colicy never totally goen bad at work yesterday.  Constipation nausea nobloating  . No blood in stool has appointment with Dr. Orvan July at the end of July but uncertain  should wait 4-6 weeks ago.  recently weight gain .  Stress with divorce. Postmenopausal sometimes goes to alternative medicine physician. ROS: See pertinent positives and negatives per HPI. Headaches left side has seen Dr. Everett Graff been stable meningioma not felt to be a problem takes vyvnase on ly occassionally  Sees dr Toy Care.  No hematuria fever uti sx .  Skin is dry using topicals was unaware of rashes has mottling on abdomen. No sig etoh  Tobacco caffiene   Past Medical History  Diagnosis Date  . IBS (irritable bowel syndrome)   . Substance addiction     recovering  . Anaphylaxis 2002    due to insect stings  . Hx of stress fx 2002    r tibia  . Allergy   . Meningioma     sacn stable no signs 1 cm rt posterior fossa 2003 dx when eval for right sided numbness  . ADHD (attention deficit hyperactivity disorder)   . Leg length discrepancy   . Benign meningioma     N[place] 1 cm r post fossa stable    Family History  Problem Relation Age of Onset  . Breast cancer Mother   . Hodgkin's lymphoma Father     History   Social History  . Marital Status: Married    Spouse Name: N/A    Number of Children: N/A  . Years of Education: N/A   Social History Main Topics  . Smoking status: Former Research scientist (life sciences)  . Smokeless tobacco: None  . Alcohol Use: No  . Drug Use: No  . Sexual Activity: None   Other Topics Concern  . 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   Not smoking          G6P3    Outpatient Encounter Prescriptions as of 03/24/2014  Medication Sig  . EPINEPHrine (EPI-PEN) 0.3 mg/0.3 mL DEVI Inject 0.3 mLs (0.3 mg total) into the muscle once.  . lisdexamfetamine (VYVANSE) 70 MG capsule Take 1 capsule (70 mg total) by mouth every morning. Fill on or after 7 30 13   . sertraline (ZOLOFT) 100 MG tablet Take 100 mg by mouth daily.  . traZODone (DESYREL) 100 MG tablet Take 100 mg by mouth at bedtime.  . progesterone (PROMETRIUM) 100 MG capsule Take 100 mg by mouth daily.  . traMADol (ULTRAM) 50 MG tablet Take 1 tablet (50 mg total) by mouth every 8 (eight) hours as needed.  . [DISCONTINUED] buPROPion (WELLBUTRIN XL) 150 MG 24 hr tablet Take 150 mg by mouth daily.    EXAM:  BP 134/100  Temp(Src) 97.9 F (36.6 C) (Oral)  Ht 5'  1" (1.549 m)  Body mass index is 0.00 kg/(m^2).  GENERAL: vitals reviewed and listed above, alert, oriented, appears well hydrated and in no acute distress no ntoxic uncomfortable hlding righ tisde  HEENT: atraumatic, conjunctiva  clear, no obvious abnormalities on inspection of external nose and ears OP : no lesion edema or exudate  NECK: no obvious masses on inspection palpation  LUNGS: clear to auscultation bilaterally, no wheezes, rales or rhonchi, good air movement ? If tender on cw squeeze test .  CV: HRRR, no clubbing cyanosis or  peripheral edema nl cap refill  shor sem lusb sitting only and no radiation Abdomen:  Sof,t normal bowel sounds without hepatosplenomegaly, tender ruq and later no murphys sign  no guarding rebound or masses no CVA tenderness MS: moves all extremities without noticeable focal  Abnormality Skin abd mottling ltyype rash ? Eczematous  Legs dry skin few excoriated areas left arms old scaring from scratching  PSYCH: pleasant  and cooperative,   Some stress  ASSESSMENT AND PLAN:  Discussed the following assessment and plan:  Colicky right upper quadrant pain - Plan: Basic metabolic panel, CBC with Differential, Hepatic function panel, TSH, T4, free, Lipid panel, Sedimentation rate, POCT urinalysis dipstick, US Abdomen Complete Recent weight gain   Check for biliary disease  Suspicious  of this .  Requests some type of pain medicator she is high risk think she should do okay on tramadol short-term. #12 tramadol given no refills Followup blood pressure readings at home or elsewhere when not in pain followup if persistent elevation. -Patient advised to return or notify health care team  if symptoms worsen ,persist or new concerns arise.  Patient Instructions  Plan laboratory tests today and a stat ultrasound to check for gallbladder disease. If all normal would get GI to evaluate consider biliary scan.   Standley Brooking. Panosh M.D.

## 2014-03-24 NOTE — Patient Instructions (Signed)
Plan laboratory tests today and a stat ultrasound to check for gallbladder disease. If all normal would get GI to evaluate consider biliary scan.

## 2014-03-25 ENCOUNTER — Telehealth: Payer: Self-pay | Admitting: Internal Medicine

## 2014-03-25 ENCOUNTER — Ambulatory Visit
Admission: RE | Admit: 2014-03-25 | Discharge: 2014-03-25 | Disposition: A | Discharge status: Home / Self Care | Payer: BC Managed Care – PPO | Source: Ambulatory Visit | Attending: Internal Medicine | Admitting: Internal Medicine

## 2014-03-25 DIAGNOSIS — R1011 Right upper quadrant pain: Secondary | ICD-10-CM

## 2014-03-25 NOTE — Telephone Encounter (Signed)
Pt would like a call back said she just finish having her [place]

## 2014-03-25 NOTE — Telephone Encounter (Signed)
Spoke to the pt about her ultrasound results.  Dr. Sherren Mocha advised that she has a gallbladder polyp that could be causing her pain.  Most likely will need a referral to general surgery.  In the meantime she should eat a fat free diet.  Advised the pt of all but will wait on a final decision from Dr. Regis Bill on Monday.

## 2014-03-29 ENCOUNTER — Other Ambulatory Visit: Payer: BC Managed Care – PPO

## 2014-03-29 NOTE — Telephone Encounter (Signed)
Please see result note.for answer

## 2014-03-30 ENCOUNTER — Telehealth: Payer: Self-pay | Admitting: Internal Medicine

## 2014-03-30 ENCOUNTER — Other Ambulatory Visit: Payer: Self-pay | Admitting: Family Medicine

## 2014-03-30 DIAGNOSIS — K824 Cholesterolosis of gallbladder: Secondary | ICD-10-CM

## 2014-03-30 NOTE — Telephone Encounter (Signed)
Referral to the surgeon has been placed.  She would like to speak to [facility] directly about results.

## 2014-03-30 NOTE — Telephone Encounter (Signed)
Pt states dr Regis Bill was to call her back and discuss her lab results. Pt is waiting on a call.

## 2014-03-30 NOTE — Telephone Encounter (Signed)
Pt called back said she is waiting for Dr Regis Bill to call her

## 2014-04-02 NOTE — Telephone Encounter (Signed)
Disc with patient still with ruq pain and bloating  Keep gi and surgery appt.   Consider gyne eval if appropriate but seem gi  Will send [place] report to dr Cristina Gong

## 2014-04-15 ENCOUNTER — Other Ambulatory Visit: Payer: Self-pay

## 2014-04-15 ENCOUNTER — Ambulatory Visit (INDEPENDENT_AMBULATORY_CARE_PROVIDER_SITE_OTHER): Payer: BC Managed Care – PPO | Admitting: Surgery

## 2014-04-15 ENCOUNTER — Other Ambulatory Visit (INDEPENDENT_AMBULATORY_CARE_PROVIDER_SITE_OTHER): Payer: Self-pay | Admitting: Surgery

## 2014-04-15 ENCOUNTER — Encounter (INDEPENDENT_AMBULATORY_CARE_PROVIDER_SITE_OTHER): Payer: Self-pay | Admitting: Surgery

## 2014-04-15 VITALS — BP 112/82 | HR 60 | Temp 98.2°F | Resp 14 | Ht 61.0 in | Wt 157.2 lb

## 2014-04-15 DIAGNOSIS — R1011 Right upper quadrant pain: Secondary | ICD-10-CM

## 2014-04-15 DIAGNOSIS — K8020 Calculus of gallbladder without cholecystitis without obstruction: Secondary | ICD-10-CM

## 2014-04-15 DIAGNOSIS — Z1231 Encounter for screening mammogram for malignant neoplasm of breast: Secondary | ICD-10-CM

## 2014-04-15 MED ORDER — TRAMADOL HCL 50 MG PO TABS: 50.0000 mg | ORAL_TABLET | Freq: Four times a day (QID) | ORAL | Status: AC | PRN

## 2014-04-15 NOTE — Progress Notes (Signed)
Patient ID: Margaret Oconnor, female   DOB: Oct 18, 1955, 58 y.o.   MRN: [id number]  Chief Complaint  Patient presents with  . Cholelithiasis    new pt-    HPI Margaret Oconnor is a 58 y.o. female.  Referred by Dr. Shanon Oconnor for evaluation of gallbladder polyp.  HPI This is a 58 year old female who presents with several months of intermittent right upper quadrant and right lateral chest pain. This is associated with some nausea and abdominal bloating. She denies any vomiting or diarrhea. There is no clear correlation to eating food. The patient underwent an ultrasound which showed a possible 5 mm gallbladder polyp but no sign of stones. There is no wall thickening. She has been referred for surgical evaluation for possible cholecystectomy.   Past Medical History  Diagnosis Date  . IBS (irritable bowel syndrome)   . Substance addiction     recovering  . Anaphylaxis 2002    due to insect stings  . Hx of stress fx 2002    r tibia  . Allergy   . Meningioma     sacn stable no signs 1 cm rt posterior fossa 2003 dx when eval for right sided numbness  . ADHD (attention deficit hyperactivity disorder)   . Leg length discrepancy   . Benign meningioma     Margaret Oconnor 1 cm r post fossa stable    Past Surgical History  Procedure Laterality Date  . Stress fracture  11/2000    rt tibia    Family History  Problem Relation Age of Onset  . Breast cancer Mother   . Hodgkin's lymphoma Father     Social History History  Substance Use Topics  . Smoking status: Former Research scientist (life sciences)  . Smokeless tobacco: Not on file  . Alcohol Use: No    Allergies  Allergen Reactions  . Bee Venom     Current Outpatient Prescriptions  Medication Sig Dispense Refill  . EPINEPHrine (EPI-PEN) 0.3 mg/0.3 mL DEVI Inject 0.3 mLs (0.3 mg total) into the muscle once.  2 Device  0  . lisdexamfetamine (VYVANSE) 70 MG capsule Take 1 capsule (70 mg total) by mouth every morning. Fill on or after 7 30 13  30  capsule  0   . sertraline (ZOLOFT) 100 MG tablet Take 100 mg by mouth daily.      . traZODone (DESYREL) 100 MG tablet Take 100 mg by mouth at bedtime.      . traMADol (ULTRAM) 50 MG tablet Take 1 tablet (50 mg total) by mouth every 6 (six) hours as needed.  40 tablet  0   No current facility-administered medications for this visit.    Review of Systems Review of Systems  Constitutional: Negative for fever, chills and unexpected weight change.  HENT: Negative for congestion, hearing loss, sore throat, trouble swallowing and voice change.   Eyes: Negative for visual disturbance.  Respiratory: Negative for cough and wheezing.   Cardiovascular: Negative for chest pain, palpitations and leg swelling.  Gastrointestinal: Positive for nausea, abdominal pain and abdominal distention. Negative for vomiting, diarrhea, constipation, blood in stool and anal bleeding.  Genitourinary: Negative for hematuria, vaginal bleeding and difficulty urinating.  Musculoskeletal: Negative for arthralgias.  Skin: Positive for rash. Negative for wound.  Neurological: Negative for seizures, syncope and headaches.  Hematological: Negative for adenopathy. Bruises/bleeds easily.  Psychiatric/Behavioral: Negative for confusion.    Blood pressure 112/82, pulse 60, temperature 98.2 F (36.8 C), temperature source Oral, resp. rate 14, height 5\' 1"  (1.549 m),  weight 157 lb 3.2 oz (71.305 kg).  Physical Exam Physical Exam WDWN in NAD HEENT:  EOMI, sclera anicteric Neck:  No masses, no thyromegaly Lungs:  CTA bilaterally; normal respiratory effort CV:  Regular rate and rhythm; no murmurs Abd:  +bowel sounds, soft, mild RUQ/ r flank tenderness Ext:  Well-perfused; no edema Skin:  Warm, dry; no sign of jaundice  Data Reviewed US Abdomen Complete  03/25/2014   CLINICAL DATA:  Abdominal pain for 6 weeks with nausea.  EXAM: ULTRASOUND ABDOMEN COMPLETE  COMPARISON:  Ultrasound, 03/15/2004.  FINDINGS: Gallbladder:  5 mm polyp. No  gallstones. No wall thickening or pericholecystic fluid.  Common bile duct:  Diameter: 4.7 mm.  Liver:  Liver shows a heterogeneous echotexture. There is a focal hyperechoic area in the right lobe measuring 3.2 cm x 1.6 cm x 1.7 cm, possibly a hemangioma. No other focal liver lesion.  IVC:  No abnormality visualized.  Pancreas:  Mostly obscured.  Portions visualized are unremarkable.  Spleen:  Size and appearance within normal limits.  Right Kidney:  Length: 10.5 cm. Echogenicity within normal limits. No mass or hydronephrosis visualized.  Left Kidney:  Length: 10.2 cm. Echogenicity within normal limits. No mass or hydronephrosis visualized.  Abdominal aorta:  No aneurysm visualized.  Other findings:  None.  IMPRESSION: 1. No acute findings. 2. 5 mm gallbladder polyp not present on the prior study. 3. Coarsened liver echotexture which is nonspecific. This could be due to fatty infiltration. Cirrhosis could have this appearance. Echogenic focus along the left lobe measuring 3.2 cm in greatest dimension. This is likely a hemangioma. Liver appeared normal on the prior study. 4. No other abnormalities.   Electronically Signed   By: Lajean Manes M.D.   On: 03/25/2014 14:55    Lab Results  Component Value Date   ALT 13 03/24/2014   AST 18 03/24/2014   ALKPHOS 76 03/24/2014   BILITOT 0.7 03/24/2014   Lab Results  Component Value Date   WBC 8.3 03/24/2014   HGB 14.9 03/24/2014   HCT 44.6 03/24/2014   MCV 94.7 03/24/2014   PLT 311.0 03/24/2014   Lab Results  Component Value Date   CREATININE 0.8 03/24/2014   BUN 14 03/24/2014   NA 136 03/24/2014   K 3.8 03/24/2014   CL 100 03/24/2014   CO2 28 03/24/2014     Assessment    Right flank pain - unclear etiology.  This could represent biliary dyskinesia vs. Some type of renal issue.       Plan    Obtain CCK-HIDA scan to rule out biliary dyskinesia.  I explained the pathophysiology of gallbladder disease to the patient and explained what we were looking for with the skin. We  will see her back for followup visit after her scan is complete. I gave her a refill of her tramadol.        Ahniya Mitchum K. 04/15/2014, 12:09 PM

## 2014-04-19 ENCOUNTER — Ambulatory Visit: Payer: BC Managed Care – PPO

## 2014-04-20 ENCOUNTER — Other Ambulatory Visit (HOSPITAL_COMMUNITY): Payer: BC Managed Care – PPO

## 2014-04-20 ENCOUNTER — Ambulatory Visit (HOSPITAL_COMMUNITY): Payer: BC Managed Care – PPO

## 2014-04-26 ENCOUNTER — Ambulatory Visit: Payer: BC Managed Care – PPO

## 2014-04-30 ENCOUNTER — Telehealth (INDEPENDENT_AMBULATORY_CARE_PROVIDER_SITE_OTHER): Payer: Self-pay

## 2014-04-30 NOTE — Telephone Encounter (Signed)
LMOM for pt to call back and ask for triage. Pt was to have [place] scan on 7-28 but is cxed in epic. Pt has appt 8-11 with Dr Georgette Dover to discuss test. Need to know if pt had test somewhere else or do we need to r/s ov and hida scan.

## 2014-05-04 ENCOUNTER — Encounter (INDEPENDENT_AMBULATORY_CARE_PROVIDER_SITE_OTHER): Payer: BC Managed Care – PPO | Admitting: Surgery

## 2014-07-12 ENCOUNTER — Ambulatory Visit: Payer: BC Managed Care – PPO

## 2014-07-26 ENCOUNTER — Encounter (INDEPENDENT_AMBULATORY_CARE_PROVIDER_SITE_OTHER): Payer: Self-pay | Admitting: Surgery

## 2014-07-26 ENCOUNTER — Ambulatory Visit: Payer: BC Managed Care – PPO

## 2014-08-09 ENCOUNTER — Ambulatory Visit: Payer: BC Managed Care – PPO

## 2014-08-16 ENCOUNTER — Ambulatory Visit: Payer: BC Managed Care – PPO

## 2014-10-04 ENCOUNTER — Ambulatory Visit: Payer: Self-pay

## 2014-11-22 ENCOUNTER — Ambulatory Visit: Payer: Self-pay

## 2014-11-29 ENCOUNTER — Encounter (INDEPENDENT_AMBULATORY_CARE_PROVIDER_SITE_OTHER): Payer: Self-pay

## 2014-11-29 ENCOUNTER — Ambulatory Visit
Admission: RE | Admit: 2014-11-29 | Discharge: 2014-11-29 | Disposition: A | Discharge status: Home / Self Care | Payer: 59 | Source: Ambulatory Visit

## 2014-11-29 DIAGNOSIS — Z1231 Encounter for screening mammogram for malignant neoplasm of breast: Secondary | ICD-10-CM

## 2015-06-23 ENCOUNTER — Ambulatory Visit (INDEPENDENT_AMBULATORY_CARE_PROVIDER_SITE_OTHER): Payer: 59 | Admitting: Family Medicine

## 2015-06-23 ENCOUNTER — Ambulatory Visit (INDEPENDENT_AMBULATORY_CARE_PROVIDER_SITE_OTHER): Payer: 59

## 2015-06-23 VITALS — BP 154/90 | HR 68 | Temp 97.9°F | Resp 18 | Ht 61.0 in | Wt 126.8 lb

## 2015-06-23 DIAGNOSIS — M7061 Trochanteric bursitis, right hip: Secondary | ICD-10-CM | POA: Diagnosis not present

## 2015-06-23 DIAGNOSIS — M25551 Pain in right hip: Secondary | ICD-10-CM

## 2015-06-23 DIAGNOSIS — F1911 Other psychoactive substance abuse, in remission: Secondary | ICD-10-CM

## 2015-06-23 DIAGNOSIS — Z5181 Encounter for therapeutic drug level monitoring: Secondary | ICD-10-CM

## 2015-06-23 DIAGNOSIS — Z87898 Personal history of other specified conditions: Secondary | ICD-10-CM | POA: Diagnosis not present

## 2015-06-23 NOTE — Progress Notes (Addendum)
Subjective:  This chart was scribed for Corliss Parish, MD by Leandra Kern, Medical Scribe. This patient was seen in Room 4 and the patient's care was started at 7:35 PM.   Patient ID: Margaret Oconnor, female    DOB: 1955/11/02, 59 y.o.   MRN: [id number]  Chief Complaint  Patient presents with  . Hip Pain    Rt hip pain x 4-5 weeks  . Need Drug Screen    HPI HPI Comments: Margaret Oconnor is a 59 y.o. female who presents to [facility] complaining of right hip pain, onset 4 weeks ago.  Pt has a history leg length discrepancy, treated by Dr. Oneida Alar in the past. She notes that she used aleve for the pain. Pt denies any traumatic injury to the area. She reports no previous adverse reaction to steroid injections. 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  Additional Hx obtained, she has only taken tramadol once yesterday from a left over prescription when seen last year by a surgeon, denies regular use. She just returned form out of town to see her mom and took 3.5 total pill of hydrocodone that was her mothers prescription to treat the right hip pain. Denies regular use of this med, and last does was yesterday evening.   Pt is also requesting a urine drug screen requested by her employer who is a physician (Dr. Barbie Banner, not her treating physician) due to a concerns of cognitive problems. She reports examples that he included such as being late to work, losing a piece of paper. She indicates that she has been going through stressful events in her private lives such as going through divorce last year, and her son's permanent dismissal from school and going to jail for drug possession during last May. Pt states that she had a history of opiates abuse (suboxone) years ago, however she was able to get off it after many years of use about 3 years ago. She notes that her employer is not aware of this and she would like to not disclose this to him. Pt denies recent use of drugs,  or alcohol abuse (no previous problems with alcohol, DWI's, or a previous family history of alcohol abuse). Pt reports that she was recently diagnosed with lyme disease by Dr. Wynona Meals. Pt takes temazapam, sertraline, Klonopin as needed, tramadol, and vyvanse.   PCP Lottie Dawson, MD, Psychiatrist is Dr. Toy Care.    Patient Active Problem List   Diagnosis Date Noted  . Colicky RUQ abdominal pain 04/15/2014  . High risk medication use 02/22/2012  . Medication monitoring encounter 02/22/2012  . SACROILIAC JOINT DYSFUNCTION 08/23/2010  . METATARSALGIA 08/23/2010  . UNEQUAL LEG LENGTH 08/23/2010  . ABNORMALITY OF GAIT 08/23/2010  . ATTENTION DEFICIT HYPERACTIVITY DISORDER, ADULT 01/16/2010  . HEADACHE 01/16/2010  . ANAPHYLACTIC REACTION 01/16/2010  . ALLERGIC RHINITIS 05/14/2009  . CONSTIPATION 02/03/2009  . IRRITABLE BOWEL SYNDROME, HX OF 05/27/2007   Past Medical History  Diagnosis Date  . IBS (irritable bowel syndrome)   . Substance addiction     recovering  . Anaphylaxis 2002    due to insect stings  . Hx of stress fx 2002    r tibia  . Allergy   . Meningioma     sacn stable no signs 1 cm rt posterior fossa 2003 dx when eval for right sided numbness  . ADHD (attention deficit hyperactivity disorder)   . Leg length discrepancy   . Benign meningioma  Nudelman 1 cm r post fossa stable   Past Surgical History  Procedure Laterality Date  . Stress fracture  11/2000    rt tibia   Allergies  Allergen Reactions  . Bee Venom    Prior to Admission medications   Medication Sig Start Date End Date Taking? Authorizing Provider  clonazePAM (KLONOPIN) 1 MG tablet Take 1 mg by mouth 2 (two) times daily.   Yes Historical Provider, MD  fluconazole (DIFLUCAN) 200 MG tablet Take 200 mg by mouth daily.   Yes Historical Provider, MD  lisdexamfetamine (VYVANSE) 70 MG capsule Take 1 capsule (70 mg total) by mouth every morning. Fill on or after 7 30 13  02/22/12  Yes Burnis Medin, MD    sertraline (ZOLOFT) 100 MG tablet Take 100 mg by mouth daily.   Yes Historical Provider, MD  temazepam (RESTORIL) 15 MG capsule Take 15 mg by mouth at bedtime as needed for sleep.   Yes Historical Provider, MD  traZODone (DESYREL) 100 MG tablet Take 100 mg by mouth at bedtime.   Yes Historical Provider, MD  EPINEPHrine (EPI-PEN) 0.3 mg/0.3 mL DEVI Inject 0.3 mLs (0.3 mg total) into the muscle once. Patient not taking: Reported on 06/23/2015 01/27/13   Burnis Medin, MD   Social History   Social History  . Marital Status: Divorced    Spouse Name: N/A  . Number of Children: N/A  . Years of Education: N/A   Occupational History  . Not on file.   Social History Main Topics  . Smoking status: Former Research scientist (life sciences)  . Smokeless tobacco: Not on file  . Alcohol Use: No  . Drug Use: No  . Sexual Activity: Not on file   Other Topics Concern  . 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   Not smoking          G6P3     Review of Systems  Musculoskeletal: Positive for myalgias and arthralgias.       Objective:   Physical Exam  Constitutional: She is oriented to person, place, and time. She appears well-developed and well-nourished. No distress.  HENT:  Head: Normocephalic and atraumatic.  Eyes: EOM are normal. Pupils are equal, round, and reactive to light.  Neck: Neck supple.  Cardiovascular: Normal rate.   Pulmonary/Chest: Effort normal.  Musculoskeletal:  Right Hip: Negative Faber test. Minimal tenderness over the distal IT band. Reproducible pain over the right trochanteric bursa, No pain in internal or external rotation of the hip.   Neurological: She is alert and oriented to person, place, and time. No cranial nerve deficit.  Skin: Skin is warm and dry.  Psychiatric: She has a normal mood and affect. Her behavior is normal.  Nursing note  and vitals reviewed.  Filed Vitals:   06/23/15 1847  BP: 154/90  Pulse: 68  Temp: 97.9 F (36.6 C)  TempSrc: Oral  Resp: 18  Height: 5\' 1"  (1.549 m)  Weight: 126 lb 12.8 oz (57.516 kg)  SpO2: 98%   [facility] (PRIMARY) x-ray report read by Dr. Corliss Parish: Right hip- degenerative changes noted at the hip joint with step-off of femoral head. Possible cystic or necrotic appearing areas.   Discussed right hip x-ray on the phone with radiologist, appears to be degenerative changes without  apparent fracture or acute findings.  Risks (including but not limited to bleeding and infection, damage to underlying or surrounding tissues), benefits, and alternatives discussed for right trochanteric bursa injection.  Verbal consent obtained after any questions were answered. Landmarks noted, and marked as needed. Area cleansed with Betadine x3, ethyl chloride spray for topical anesthesia, followed by alcohol swab.  Injected with 1 mL of Kenalog 40 mg per mL mixed with 1 mL of lidocaine 1% plain.  No complications. Bandage applied.  Minimal change in symptoms after injection. Ambulating with slight antalgia as prior to injection. RTC precautions discussed in regards to injection, including but not limited to increasing pain, redness, or other signs or symptoms of infection.     Assessment & Plan:   Margaret Oconnor is a 59 y.o. female History of substance abuse - Plan: Prescript Monitor Profile (7), CANCELED: Prescript Monitor Profile (9)Medication monitoring encounter - Plan: Prescript Monitor Profile (7), CANCELED: Prescript Monitor Profile (9)  -Remote history of substance abuse, followed by 7 years of Suboxone. However she has been off of this medication for years, denies any recent problems with narcotic abuse or addiction.  She does admit to one tramadol recently that she had left over from abdominal issues last year, and 3-1/2 pills of her mother's hydrocodone when she was recently traveling due to the  pain in her right hip.  -Discussed likely positives based on her current medication regimen on her drug screen, and that if needed I can provide a letter indicating what history was provided to me on recent medication use and current prescriptions if needed to correlate with the drug screen.  -Discussed not using other's medications in the future, even though I understand she was using this for acute pain in the hip.  With remote history of narcotic use/abuse, especially would want to make sure she is not using this medication if not prescribed for her. Understanding expressed. Denies recent issues with this medication.   Right hip pain - Plan: DG HIP UNILAT W OR W/O PELVIS 2-3 VIEWS RIGHT, DG Pelvis 1-2 Views Trochanteric bursitis of right hip - Plan: DG HIP UNILAT W OR W/O PELVIS 2-3 VIEWS RIGHT, DG Pelvis 1-2 Views  -Degenerative changes/arthritis seen at the hip joint proper, but her primary area of pain on exam is limited to the trochanteric bursa. Did not appear to have pain with internal or external rotation of her hip.  -Options discussed for her trochanteric bursitis including home exercise program, symptomatic care with heat or ice, and continuing over-the-counter or oral medications. She chose to have injection tonight based on inadequate treatment with over-the-counter treatments. Injection as above without complications. If persistent pain in the hip, would recommend she follow up with orthopedist, and I can refer her without an office visit if needed.   Meds ordered this encounter   Patient Instructions  Once we receive the results of the drug screen, I can have this printed and available for you to pick up and share with your employer if needed.  As discussed, it likely should be positive for benzodiazepines, tramadol, hydrocodone, and amphetamines with the use of Vyvanse.     Your hip pain appears to be due to trochanteric bursitis as discussed. See information below on this  condition to lessen chance of recurrent future. As we discussed watch for any signs of redness discharge or other signs of infection from the area that was injected today.  If the injection is not helping the next few days, or  you have recurrence of your symptoms, would recommend follow-up with orthopedist.  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 If you continue to have pain into the hip joint, let me know and I'll be happy to refer you to an orthopedist.  Hip Bursitis Bursitis is a swelling and soreness (inflammation) of a fluid-filled sac (bursa). This sac overlies and protects the joints.  CAUSES   Injury.  Overuse of the muscles surrounding the joint.  Arthritis.  Gout.  Infection.  Cold weather.  Inadequate warm-up and conditioning prior to activities. The cause may not be known.  SYMPTOMS   Mild to severe irritation.  Tenderness and swelling over the outside of the hip.  Pain with motion of the hip.  If the bursa becomes infected, a fever may be present. Redness, tenderness, and warmth will develop over the hip. Symptoms usually lessen in 3 to 4 weeks with treatment, but can come back. TREATMENT If conservative treatment does not work, your caregiver may advise draining the bursa and injecting cortisone into the area. This may speed up the healing process. This may also be used as an initial treatment of choice. HOME CARE INSTRUCTIONS   Apply ice to the affected area for 15-20 minutes every 3 to 4 hours while awake for the first 2 days. Put the ice in a plastic bag and place a towel between the bag of ice and your skin.  Rest the painful joint as much as possible, but continue to put the joint through a normal range of motion at least 4 times per day. When the pain lessens, begin normal, slow movements and usual activities to help prevent stiffness of the  hip.  Only take over-the-counter or prescription medicines for pain, discomfort, or fever as directed by your caregiver.  Use crutches to limit weight bearing on the hip joint, if advised.  Elevate your painful hip to reduce swelling. Use pillows for propping and cushioning your legs and hips.  Gentle massage may provide comfort and decrease swelling. SEEK IMMEDIATE MEDICAL CARE IF:   Your pain increases even during treatment, or you are not improving.  You have a fever.  You have heat and inflammation over the involved bursa.  You have any other questions or concerns. MAKE SURE YOU:   Understand these instructions.  Will watch your condition.  Will get help right away if you are not doing well or get worse. Document Released: 03/02/2002 Document Revised: 12/03/2011 Document Reviewed: 09/29/2008 [facility] Patient Information 2015 [place], [place]. This information is not intended to replace advice given to you by your health care provider. Make sure you discuss any questions you have with your health care provider.  Trochanteric Bursitis You have hip pain due to trochanteric bursitis. Bursitis means that the sack near the outside of the hip is filled with fluid and inflamed. This sack is made up of protective soft tissue. The pain from trochanteric bursitis can be severe and keep you from sleep. It can radiate to the buttocks or down the outside of the thigh to the knee. The pain is almost always worse when rising from the seated or lying position and with walking. Pain can improve after you take a few steps. It happens more often in people with hip joint and lumbar spine problems, such as arthritis or previous surgery. Very rarely the trochanteric bursa can become infected, and antibiotics  and/or surgery may be needed. Treatment often includes an injection of local anesthetic mixed with cortisone medicine. This medicine is injected into the area where it is most tender over the hip.  Repeat injections may be necessary if the response to treatment is slow. You can apply ice packs over the tender area for 30 minutes every 2 hours for the next few days. Anti-inflammatory and/or narcotic pain medicine may also be helpful. Limit your activity for the next few days if the pain continues. See your caregiver in 5-10 days if you are not greatly improved.  SEEK IMMEDIATE MEDICAL CARE IF:  You develop severe pain, fever, or increased redness.  You have pain that radiates below the knee. EXERCISES STRETCHING EXERCISES - Trochanteric Bursitis  These exercises may help you when beginning to rehabilitate your injury. Your symptoms may resolve with or without further involvement from your physician, physical therapist, or athletic trainer. While completing these exercises, remember:   Restoring tissue flexibility helps normal motion to return to the joints. This allows healthier, less painful movement and activity.  An effective stretch should be held for at least 30 seconds.  A stretch should never be painful. You should only feel a gentle lengthening or release in the stretched tissue. STRETCH - Iliotibial Band  On the floor or bed, lie on your side so your injured leg is on top. Bend your knee and grab your ankle.  Slowly bring your knee back so that your thigh is in line with your trunk. Keep your heel at your buttocks and gently arch your back so your head, shoulders and hips line up.  Slowly lower your leg so that your knee approaches the floor/bed until you feel a gentle stretch on the outside of your thigh. If you do not feel a stretch and your knee will not fall farther, place the heel of your opposite foot on top of your knee and pull your thigh down farther.  Hold this stretch for __________ seconds.  Repeat __________ times. Complete this exercise __________ times per day. STRETCH - Hamstrings, Supine   Lie on your back. Loop a belt or towel over the ball of your foot as  shown.  Straighten your knee and slowly pull on the belt to raise your injured leg. Do not allow the knee to bend. Keep your opposite leg flat on the floor.  Raise the leg until you feel a gentle stretch behind your knee or thigh. Hold this position for __________ seconds.  Repeat __________ times. Complete this stretch __________ times per day. STRETCH - Quadriceps, Prone   Lie on your stomach on a firm surface, such as a bed or padded floor.  Bend your knee and grasp your ankle. If you are unable to reach your ankle or pant leg, use a belt around your foot to lengthen your reach.  Gently pull your heel toward your buttocks. Your knee should not slide out to the side. You should feel a stretch in the front of your thigh and/or knee.  Hold this position for __________ seconds.  Repeat __________ times. Complete this stretch __________ times per day. STRETCHING - Hip Flexors, Lunge Half kneel with your knee on the floor and your opposite knee bent and directly over your ankle.  Keep good posture with your head over your shoulders. Tighten your buttocks to point your tailbone downward; this will prevent your back from arching too much.  You should feel a gentle stretch in the front of your thigh and/or  hip. If you do not feel any resistance, slightly slide your opposite foot forward and then slowly lunge forward so your knee once again lines up over your ankle. Be sure your tailbone remains pointed downward.  Hold this stretch for __________ seconds.  Repeat __________ times. Complete this stretch __________ times per day. STRETCH - Adductors, Lunge  While standing, spread your legs.  Lean away from your injured leg by bending your opposite knee. You may rest your hands on your thigh for balance.  You should feel a stretch in your inner thigh. Hold for __________ seconds.  Repeat __________ times. Complete this exercise __________ times per day. Document Released: 10/18/2004  Document Revised: 01/25/2014 Document Reviewed: 12/23/2008 [facility] Patient Information 2015 [place], [place]. This information is not intended to replace advice given to you by your health care provider. Make sure you discuss any questions you have with your health care provider.        By signing my name below, I, Rawaa Al Rifaie, attest that this documentation has been prepared under the direction and in the presence of Corliss Parish, MD.  Leandra Kern, Medical Scribe. 06/23/2015.  8:08 PM.   I personally performed the services described in this documentation, which was scribed in my presence. The recorded information has been reviewed and considered, and addended by me as needed.

## 2015-06-23 NOTE — Patient Instructions (Addendum)
Once we receive the results of the drug screen, I can have this printed and available for you to pick up and share with your employer if needed.  As discussed, it likely should be positive for benzodiazepines, tramadol, hydrocodone, and amphetamines with the use of Vyvanse.     Your hip pain appears to be due to trochanteric bursitis as discussed. See information below on this condition to lessen chance of recurrent future. As we discussed watch for any signs of redness discharge or other signs of infection from the area that was injected today.  If the injection is not helping the next few days, or you have recurrence of your symptoms, would recommend follow-up with orthopedist.  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 If you continue to have pain into the hip joint, let me know and I'll be happy to refer you to an orthopedist.  Hip Bursitis Bursitis is a swelling and soreness (inflammation) of a fluid-filled sac (bursa). This sac overlies and protects the joints.  CAUSES   Injury.  Overuse of the muscles surrounding the joint.  Arthritis.  Gout.  Infection.  Cold weather.  Inadequate warm-up and conditioning prior to activities. The cause may not be known.  SYMPTOMS   Mild to severe irritation.  Tenderness and swelling over the outside of the hip.  Pain with motion of the hip.  If the bursa becomes infected, a fever may be present. Redness, tenderness, and warmth will develop over the hip. Symptoms usually lessen in 3 to 4 weeks with treatment, but can come back. TREATMENT If conservative treatment does not work, your caregiver may advise draining the bursa and injecting cortisone into the area. This may speed up the healing process. This may also be used as an initial treatment of choice. HOME CARE INSTRUCTIONS   Apply ice to the affected area for 15-20  minutes every 3 to 4 hours while awake for the first 2 days. Put the ice in a plastic bag and place a towel between the bag of ice and your skin.  Rest the painful joint as much as possible, but continue to put the joint through a normal range of motion at least 4 times per day. When the pain lessens, begin normal, slow movements and usual activities to help prevent stiffness of the hip.  Only take over-the-counter or prescription medicines for pain, discomfort, or fever as directed by your caregiver.  Use crutches to limit weight bearing on the hip joint, if advised.  Elevate your painful hip to reduce swelling. Use pillows for propping and cushioning your legs and hips.  Gentle massage may provide comfort and decrease swelling. SEEK IMMEDIATE MEDICAL CARE IF:   Your pain increases even during treatment, or you are not improving.  You have a fever.  You have heat and inflammation over the involved bursa.  You have any other questions or concerns. MAKE SURE YOU:   Understand these instructions.  Will watch your condition.  Will get help right away if you are not doing well or get worse. Document Released: 03/02/2002 Document Revised: 12/03/2011 Document Reviewed: 09/29/2008 [facility] Patient Information 2015 [place], [place]. This information is not intended to replace advice given to you by your health care provider. Make sure you discuss any questions you have with your health care provider.  Trochanteric Bursitis You have hip pain due to trochanteric bursitis. Bursitis  means that the sack near the outside of the hip is filled with fluid and inflamed. This sack is made up of protective soft tissue. The pain from trochanteric bursitis can be severe and keep you from sleep. It can radiate to the buttocks or down the outside of the thigh to the knee. The pain is almost always worse when rising from the seated or lying position and with walking. Pain can improve after you take a few steps.  It happens more often in people with hip joint and lumbar spine problems, such as arthritis or previous surgery. Very rarely the trochanteric bursa can become infected, and antibiotics and/or surgery may be needed. Treatment often includes an injection of local anesthetic mixed with cortisone medicine. This medicine is injected into the area where it is most tender over the hip. Repeat injections may be necessary if the response to treatment is slow. You can apply ice packs over the tender area for 30 minutes every 2 hours for the next few days. Anti-inflammatory and/or narcotic pain medicine may also be helpful. Limit your activity for the next few days if the pain continues. See your caregiver in 5-10 days if you are not greatly improved.  SEEK IMMEDIATE MEDICAL CARE IF:  You develop severe pain, fever, or increased redness.  You have pain that radiates below the knee. EXERCISES STRETCHING EXERCISES - Trochanteric Bursitis  These exercises may help you when beginning to rehabilitate your injury. Your symptoms may resolve with or without further involvement from your physician, physical therapist, or athletic trainer. While completing these exercises, remember:   Restoring tissue flexibility helps normal motion to return to the joints. This allows healthier, less painful movement and activity.  An effective stretch should be held for at least 30 seconds.  A stretch should never be painful. You should only feel a gentle lengthening or release in the stretched tissue. STRETCH - Iliotibial Band  On the floor or bed, lie on your side so your injured leg is on top. Bend your knee and grab your ankle.  Slowly bring your knee back so that your thigh is in line with your trunk. Keep your heel at your buttocks and gently arch your back so your head, shoulders and hips line up.  Slowly lower your leg so that your knee approaches the floor/bed until you feel a gentle stretch on the outside of your thigh.  If you do not feel a stretch and your knee will not fall farther, place the heel of your opposite foot on top of your knee and pull your thigh down farther.  Hold this stretch for __________ seconds.  Repeat __________ times. Complete this exercise __________ times per day. STRETCH - Hamstrings, Supine   Lie on your back. Loop a belt or towel over the ball of your foot as shown.  Straighten your knee and slowly pull on the belt to raise your injured leg. Do not allow the knee to bend. Keep your opposite leg flat on the floor.  Raise the leg until you feel a gentle stretch behind your knee or thigh. Hold this position for __________ seconds.  Repeat __________ times. Complete this stretch __________ times per day. STRETCH - Quadriceps, Prone   Lie on your stomach on a firm surface, such as a bed or padded floor.  Bend your knee and grasp your ankle. If you are unable to reach your ankle or pant leg, use a belt around your foot to lengthen your reach.  Gently pull  your heel toward your buttocks. Your knee should not slide out to the side. You should feel a stretch in the front of your thigh and/or knee.  Hold this position for __________ seconds.  Repeat __________ times. Complete this stretch __________ times per day. STRETCHING - Hip Flexors, Lunge Half kneel with your knee on the floor and your opposite knee bent and directly over your ankle.  Keep good posture with your head over your shoulders. Tighten your buttocks to point your tailbone downward; this will prevent your back from arching too much.  You should feel a gentle stretch in the front of your thigh and/or hip. If you do not feel any resistance, slightly slide your opposite foot forward and then slowly lunge forward so your knee once again lines up over your ankle. Be sure your tailbone remains pointed downward.  Hold this stretch for __________ seconds.  Repeat __________ times. Complete this stretch __________ times  per day. STRETCH - Adductors, Lunge  While standing, spread your legs.  Lean away from your injured leg by bending your opposite knee. You may rest your hands on your thigh for balance.  You should feel a stretch in your inner thigh. Hold for __________ seconds.  Repeat __________ times. Complete this exercise __________ times per day. Document Released: 10/18/2004 Document Revised: 01/25/2014 Document Reviewed: 12/23/2008 [facility] Patient Information 2015 [place], [place]. This information is not intended to replace advice given to you by your health care provider. Make sure you discuss any questions you have with your health care provider.

## 2015-06-28 ENCOUNTER — Telehealth: Payer: Self-pay

## 2015-06-28 DIAGNOSIS — M7061 Trochanteric bursitis, right hip: Secondary | ICD-10-CM

## 2015-06-28 NOTE — Telephone Encounter (Signed)
The patient called to request a referral to an orthopedic surgeon.  She wanted to know if Dr. Carlota Raspberry would recommend an orthopedist.  She mentioned [place] with Dr. Deloris Ping, PA and wanted to know what Dr. Carlota Raspberry thought.  Please advise, thank you.  CB#: [phone]

## 2015-06-29 NOTE — Telephone Encounter (Signed)
Can refer her to any ortho she prefers.. I think we had discussed Dr. Wynelle Link at [facility] as well.  Please refer to her preference for eval and treat R hip pain, advanced hip arthritis. Thanks.

## 2015-06-29 NOTE — Telephone Encounter (Signed)
Options discussed for her trochanteric bursitis including home exercise program, symptomatic care with heat or ice, and continuing over-the-counter or oral medications. She chose to have injection tonight based on inadequate treatment with over-the-counter treatments. Injection as above without complications. If persistent pain in the hip, would recommend she follow up with orthopedist, and I can refer her without an office visit if needed.   Which orthopedist do you recommend for pt?

## 2015-06-30 LAB — OPIATES/OPIOIDS (LC/MS-MS)
CODEINE URINE: NEGATIVE ng/mL (ref <50)
HYDROMORPHONE: NEGATIVE ng/mL (ref <50)
Hydrocodone: 50 ng/mL — AB (ref <50)
MORPHINE: NEGATIVE ng/mL (ref <50)
NORHYDROCODONE, UR: 60 ng/mL — AB (ref <50)
Noroxycodone, Ur: NEGATIVE ng/mL (ref <50)
OXYCODONE, UR: NEGATIVE ng/mL (ref <50)
OXYMORPHONE, URINE: NEGATIVE ng/mL (ref <50)

## 2015-06-30 LAB — BENZODIAZEPINES (GC/LC/MS), URINE
Alprazolam metabolite (GC/LC/MS), ur confirm: NEGATIVE ng/mL (ref <25)
Clonazepam metabolite (GC/LC/MS), ur confirm: 292 ng/mL — AB (ref <25)
FLURAZEPAMU: NEGATIVE ng/mL (ref <50)
LORAZEPAMU: NEGATIVE ng/mL (ref <50)
MIDAZOLAMU: NEGATIVE ng/mL (ref <50)
Nordiazepam (GC/LC/MS), ur confirm: NEGATIVE ng/mL (ref <50)
Oxazepam (GC/LC/MS), ur confirm: 106 ng/mL — AB (ref <50)
TRIAZOLAMU: NEGATIVE ng/mL (ref <50)
Temazepam (GC/LC/MS), ur confirm: 8054 ng/mL — AB (ref <50)

## 2015-06-30 LAB — AMPHETAMINES (GC/LC/MS), URINE
Amphetamine GC/MS Conf: 1937 ng/mL — AB (ref <250)
Methamphetamine Quant, Ur: NEGATIVE ng/mL (ref <250)

## 2015-07-01 LAB — PRESCRIPTION MONITORING PROFILE (7 PANEL)
COCAINE METABOLITES: NEGATIVE ng/mL
CREATININE, URINE: 43.61 mg/dL (ref >20.0)
Cannabinoid Scrn, Ur: NEGATIVE ng/mL
METHADONE SCREEN, URINE: NEGATIVE ng/mL
Nitrites, Initial: NEGATIVE ug/mL
OXYCODONE SCRN UR: NEGATIVE ng/mL
pH, Initial: 7.5 pH (ref 4.5–8.9)

## 2015-07-04 NOTE — Telephone Encounter (Signed)
Referral placed to [employer].

## 2015-07-08 IMAGING — US US ABDOMEN COMPLETE
1 series · 13 of 25 positions shown · non-contrast
Comparison: Ultrasound, 03/15/2004.

CLINICAL DATA: Abdominal pain for 6 weeks with nausea.

EXAM:
ULTRASOUND ABDOMEN COMPLETE

[Series 1: us abdomen complete · 0.21mm/px · 13 of 84 slices shown]
[im 1/84]
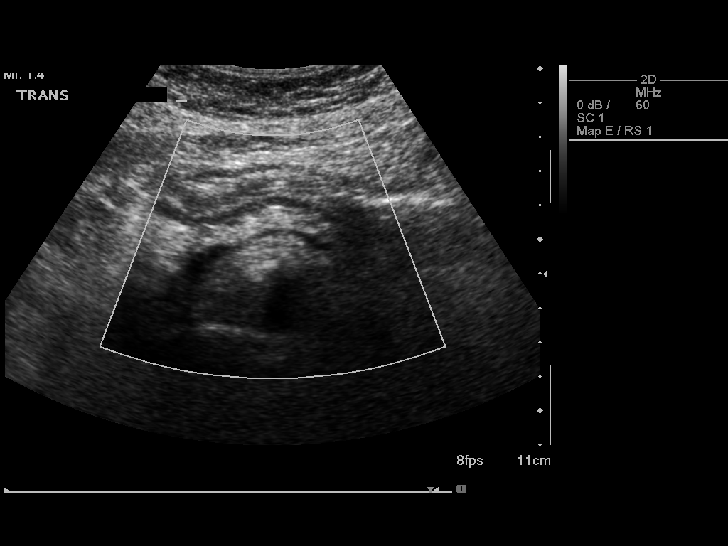
[im 7/84]
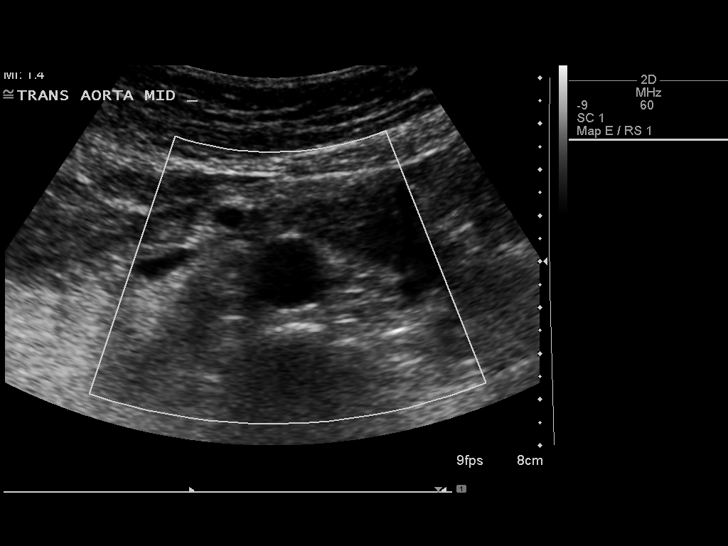
[im 14/84]
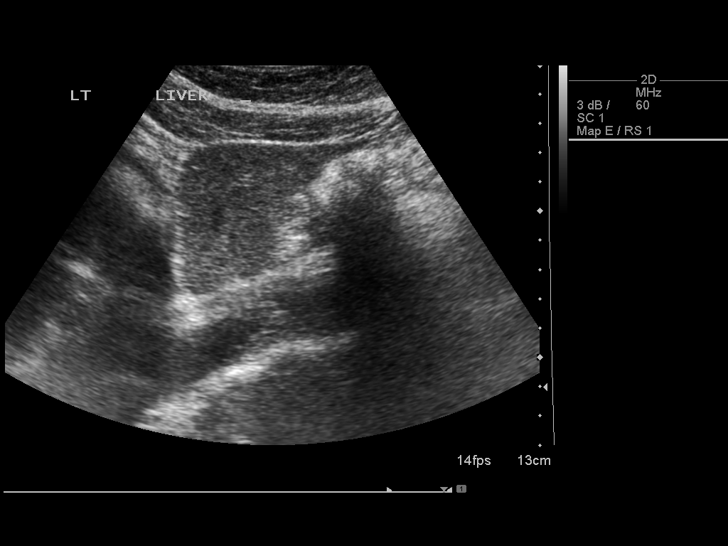
[im 21/84]
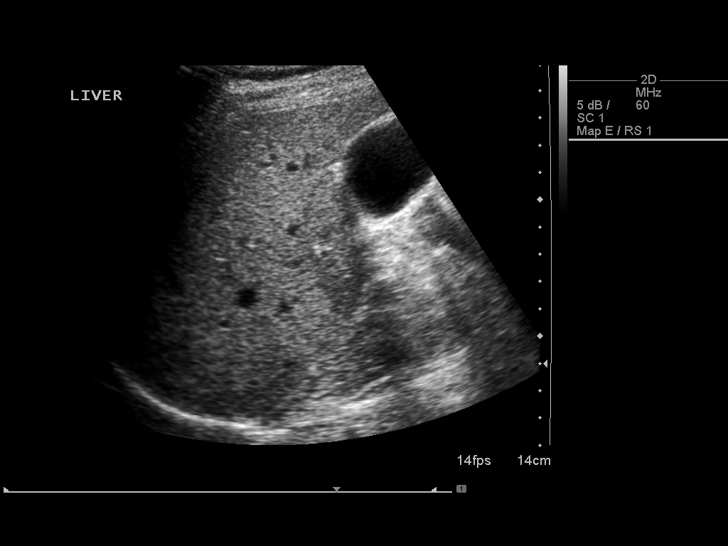
[im 28/84]
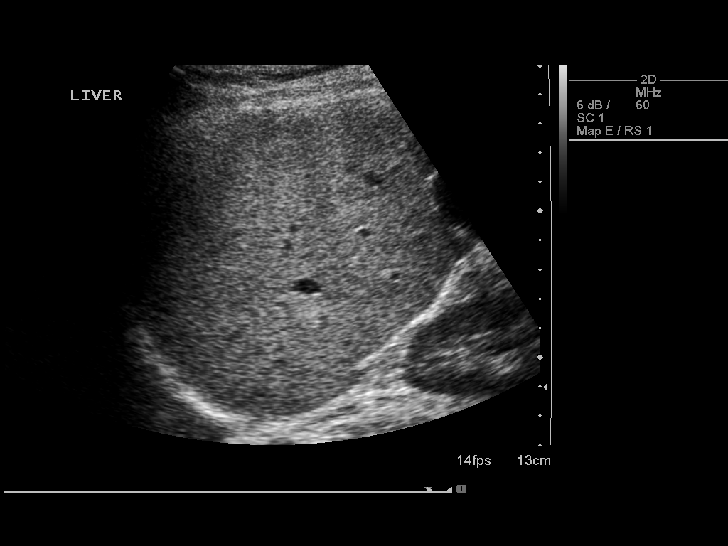
[im 35/84]
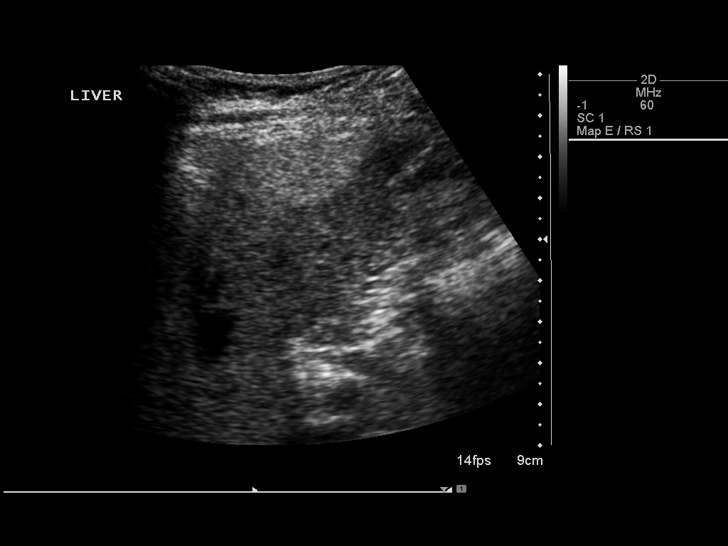
[im 42/84]
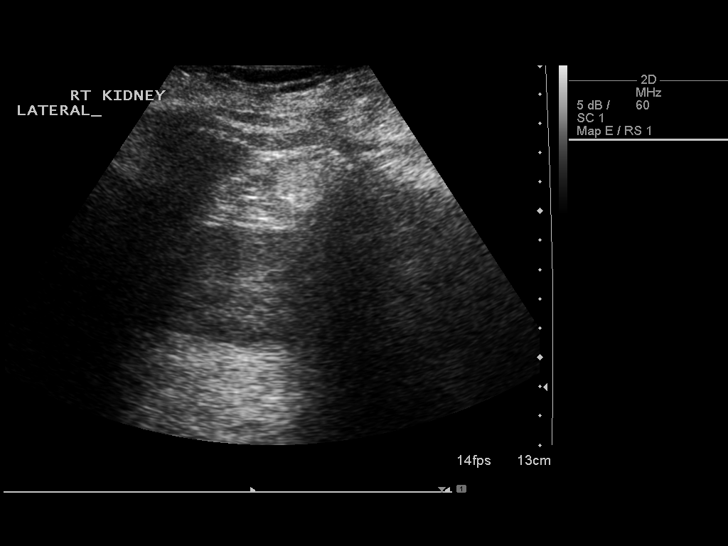
[im 49/84]
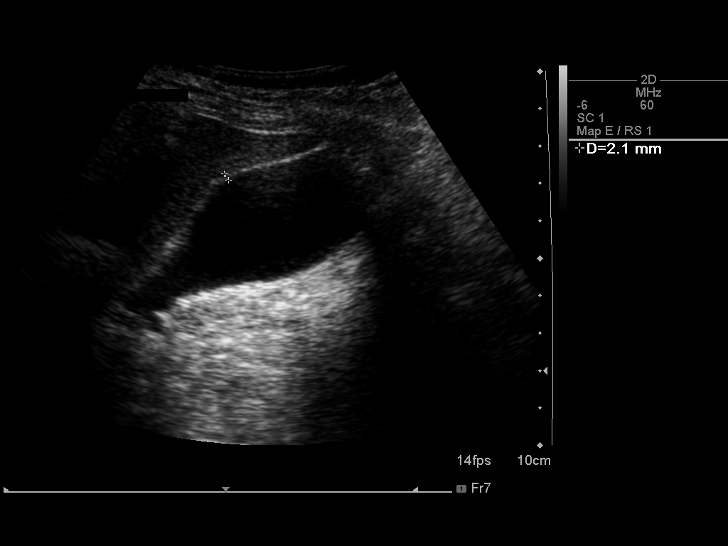
[im 56/84]
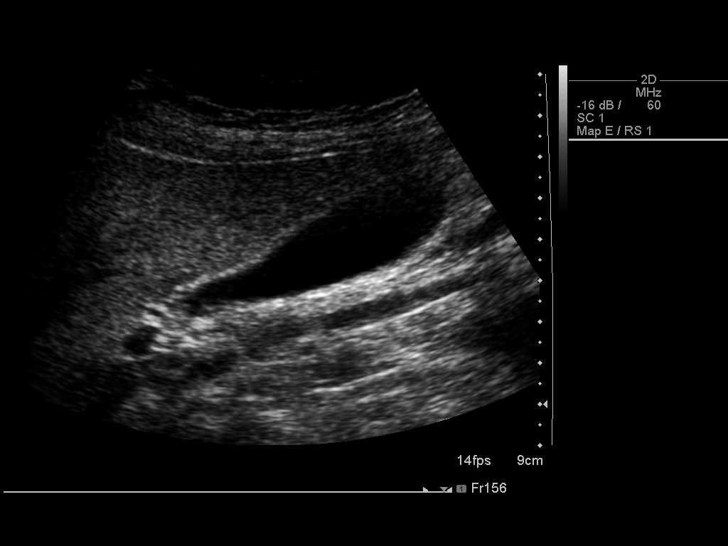
[im 63/84]
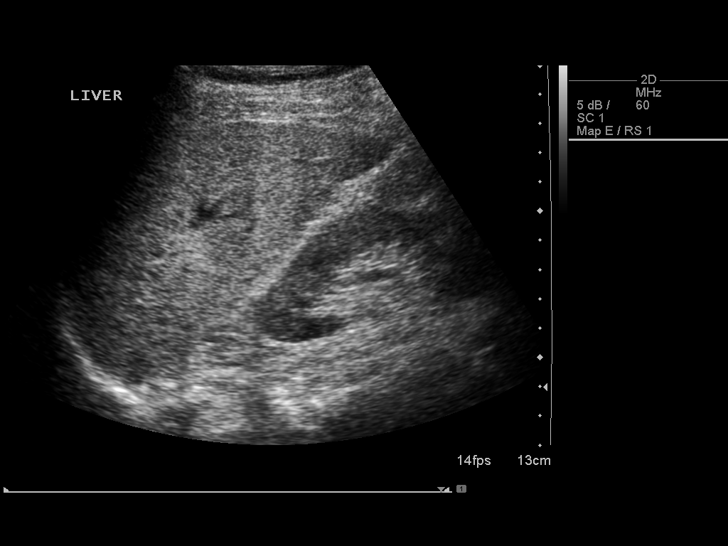
[im 70/84]
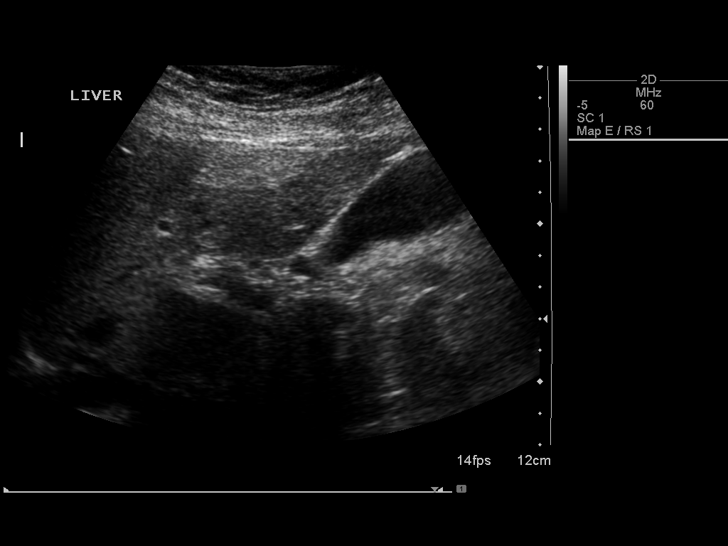
[im 77/84]
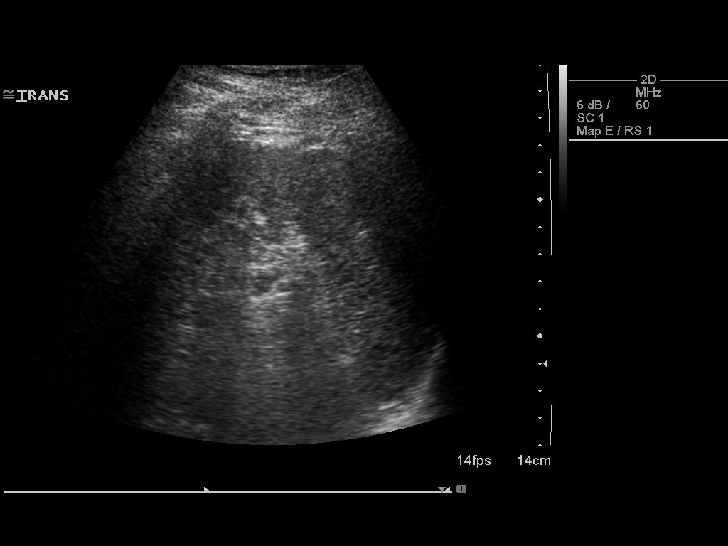
[im 84/84]
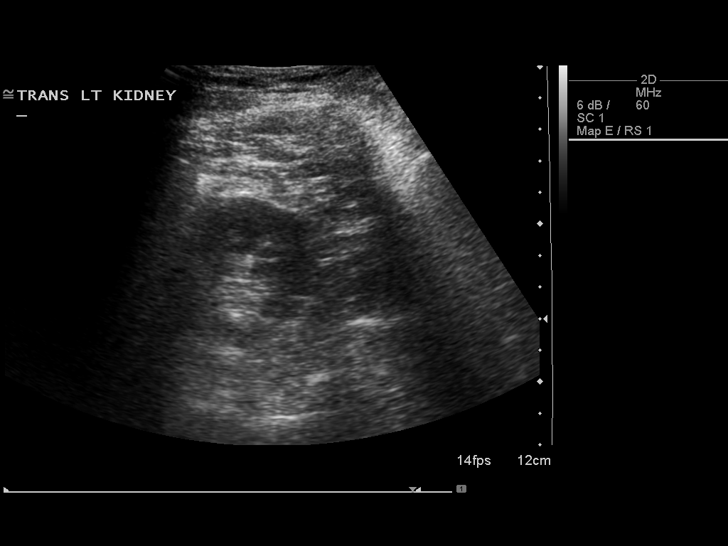

[13 of 25 positions shown; findings below may reference images not displayed]

FINDINGS: Gallbladder:

5 mm polyp. No gallstones. No wall thickening or pericholecystic
fluid.

Common bile duct:

Diameter: 4.7 mm.

Liver:

Liver shows a heterogeneous echotexture. There is a focal
hyperechoic area in the right lobe measuring 3.2 cm x 1.6 cm x
cm, possibly a hemangioma. No other focal liver lesion.

IVC:

No abnormality visualized.

Pancreas:

Mostly obscured.  Portions visualized are unremarkable.

Spleen:

Size and appearance within normal limits.

Right Kidney:

Length: 10.5 cm. Echogenicity within normal limits. No mass or
hydronephrosis visualized.

Left Kidney:

Length: 10.2 cm. Echogenicity within normal limits. No mass or
hydronephrosis visualized.

Abdominal aorta:

No aneurysm visualized.

Other findings:

None.
IMPRESSION: 1. No acute findings.
2. 5 mm gallbladder polyp not present on the prior study.
3. Coarsened liver echotexture which is nonspecific. This could be
due to fatty infiltration. Cirrhosis could have this appearance.
Echogenic focus along the left lobe measuring 3.2 cm in greatest
dimension. This is likely a hemangioma. Liver appeared normal on the
prior study.
4. No other abnormalities.

## 2015-07-26 ENCOUNTER — Telehealth: Payer: Self-pay

## 2015-07-26 NOTE — Telephone Encounter (Signed)
Labs can be printed for her to pick up.  I apologize if she had not received these - I was under the impression she had received the results from my last note. As I put in that note: 3 different types of benzodiazepines were detected, but I do not know if there is some cross reactivity. Additionally hydrocodone and norhydrocodone was detected, but again I'm not sure if there is some cross reactivity.  Also positive for amphetamines.   As far as note for work, more info needed. Why did she need a note to [employer]? Has she not been back since my OV?  If there is a concern about her cognition and ability to work, needs OV to discuss this further.

## 2015-07-26 NOTE — Telephone Encounter (Signed)
Advised pt of labs.  She stated that she is on Tramadol if that means anything.

## 2015-07-26 NOTE — Telephone Encounter (Signed)
Needs a note to employer saying that she is cognitive to work.  She would like this today Also needs labs printed.  Labs printed and up at front desk

## 2015-07-26 NOTE — Telephone Encounter (Signed)
Error

## 2015-07-26 NOTE — Telephone Encounter (Signed)
Patient is calling because she want to ask Dr. Carlota Raspberry about her drug screen results and she would also like to be able to get the results printed.  [phone]

## 2015-07-27 NOTE — Telephone Encounter (Signed)
Left message for pt to call back  °

## 2015-09-28 ENCOUNTER — Other Ambulatory Visit: Payer: Self-pay | Admitting: Obstetrics & Gynecology

## 2015-09-28 DIAGNOSIS — Z780 Asymptomatic menopausal state: Secondary | ICD-10-CM

## 2016-10-24 ENCOUNTER — Other Ambulatory Visit: Payer: Self-pay | Admitting: Obstetrics & Gynecology

## 2016-10-24 DIAGNOSIS — Z1231 Encounter for screening mammogram for malignant neoplasm of breast: Secondary | ICD-10-CM

## 2018-02-03 ENCOUNTER — Other Ambulatory Visit: Payer: Self-pay | Admitting: Obstetrics & Gynecology

## 2018-02-03 DIAGNOSIS — Z1231 Encounter for screening mammogram for malignant neoplasm of breast: Secondary | ICD-10-CM

## 2018-03-18 ENCOUNTER — Other Ambulatory Visit: Payer: Self-pay | Admitting: Obstetrics & Gynecology

## 2018-03-18 DIAGNOSIS — E2839 Other primary ovarian failure: Secondary | ICD-10-CM

## 2018-05-09 ENCOUNTER — Other Ambulatory Visit: Payer: Self-pay | Admitting: Neurosurgery

## 2018-05-09 DIAGNOSIS — D329 Benign neoplasm of meninges, unspecified: Secondary | ICD-10-CM

## 2018-05-20 ENCOUNTER — Other Ambulatory Visit: Payer: 59

## 2018-06-02 ENCOUNTER — Other Ambulatory Visit: Payer: BC Managed Care – PPO

## 2018-06-03 ENCOUNTER — Ambulatory Visit
Admission: RE | Admit: 2018-06-03 | Discharge: 2018-06-03 | Disposition: A | Discharge status: Home / Self Care | Payer: BC Managed Care – PPO | Source: Ambulatory Visit | Attending: Neurosurgery | Admitting: Neurosurgery

## 2018-06-03 DIAGNOSIS — D329 Benign neoplasm of meninges, unspecified: Secondary | ICD-10-CM

## 2018-06-03 MED ORDER — GADOBENATE DIMEGLUMINE 529 MG/ML IV SOLN
13.0000 mL | Freq: Once | INTRAVENOUS | Status: AC | PRN
  Administered 2018-06-03: 13 mL via INTRAVENOUS

## 2018-06-13 ENCOUNTER — Other Ambulatory Visit: Payer: Self-pay | Admitting: Family

## 2019-09-16 IMAGING — MR MR HEAD WO/W CM
12 series · 48 of 48 positions shown · IV contrast (multihance)
Comparison: MRI head 10/20/2013

CLINICAL DATA: Benign neoplasm of meninges.

Creatinine was obtained on site at [HOSPITAL] at [HOSPITAL].
Results: Creatinine 0.9 mg/dL.
EXAM:
MRI HEAD WITHOUT AND WITH CONTRAST
TECHNIQUE: Multiplanar, multiecho pulse sequences of the brain and surrounding
structures were obtained without and with intravenous contrast.
CONTRAST:  13mL MULTIHANCE GADOBENATE DIMEGLUMINE 529 MG/ML IV SOLN

[Series 2: t1_se_sag · sagittal · 5.0mm · 0.45mm/px · 1 of 21 slices shown]
[im 1/21]
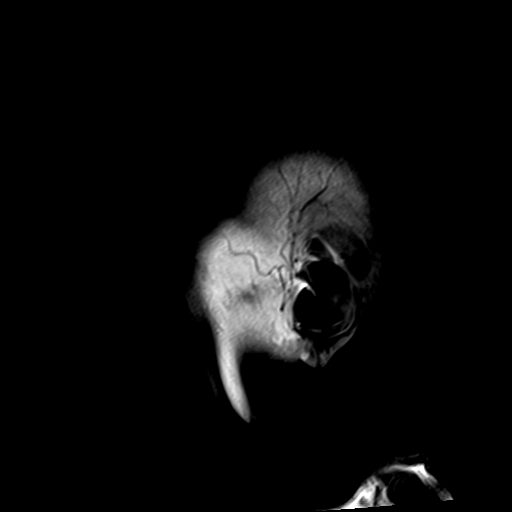

[Series 4: ep2d_diff_cor · coronal · 5.0mm · 1.77mm/px · 3 of 53 slices shown]
[im 1/53]
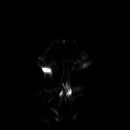
[im 27/53]
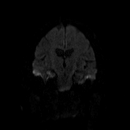
[im 53/53]
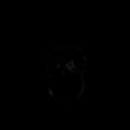

[Series 5: ep2d_diff_cor_adc · coronal · 5.0mm · 1.77mm/px · 2 of 27 slices shown]
[im 1/27]
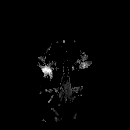
[im 27/27]
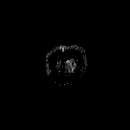

[Series 6: ep2d_diff_(id)_trace · axial · 3.0mm · 1.80mm/px · z∈[-81,+69]mm · 6 of 97 slices shown]
[im 1/97]
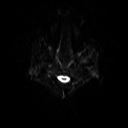
[im 20/97]
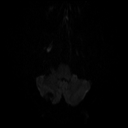
[im 39/97]
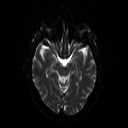
[im 58/97]
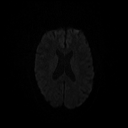
[im 77/97]
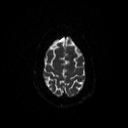
[im 97/97]
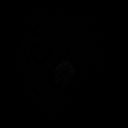

[Series 7: ep2d_diff_(id)_trace_adc · axial · 3.0mm · 1.80mm/px · z∈[-81,+69]mm · 3 of 51 slices shown]
[im 1/51]
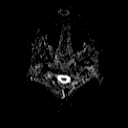
[im 26/51]
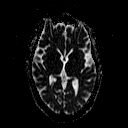
[im 51/51]
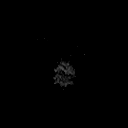

[Series 8: FLAIR · axial · 3.0mm · 0.45mm/px · z∈[-84,+72]mm · 2 of 27 slices shown]
[im 1/27]
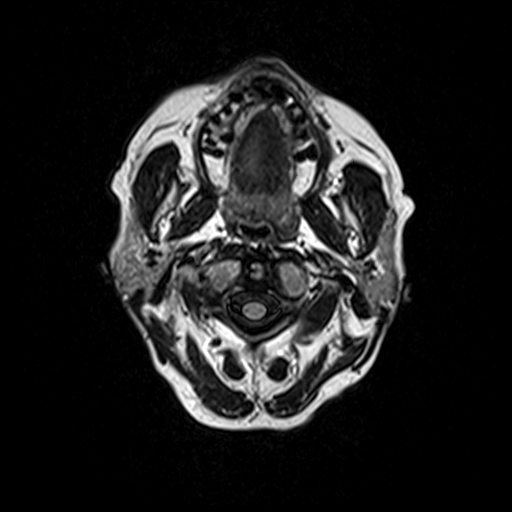
[im 27/27]
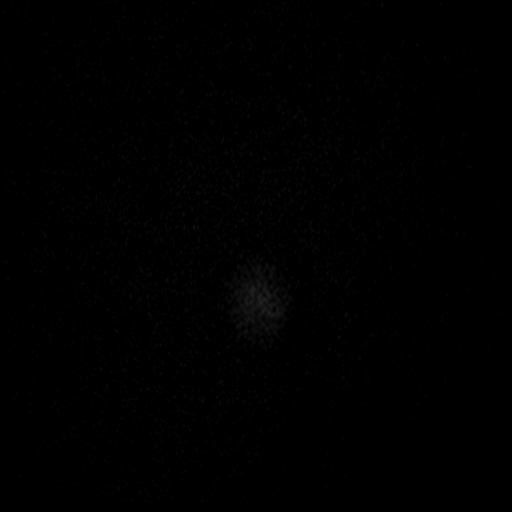

[Series 11: swi_images · axial · 2.0mm · 0.90mm/px · z∈[-78,+63]mm · 5 of 72 slices shown]
[im 1/72]
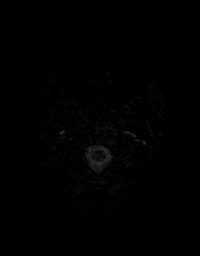
[im 18/72]
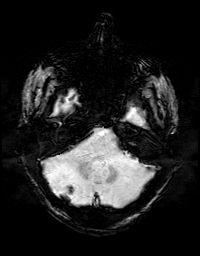
[im 36/72]
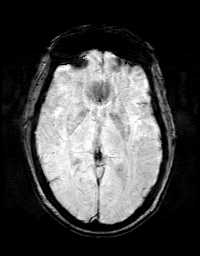
[im 54/72]
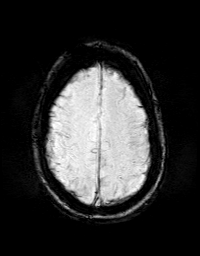
[im 72/72]
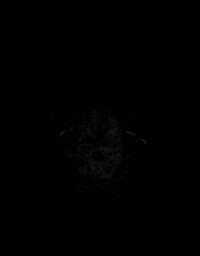

[Series 12: t1_mpr_tra · axial · 1.0mm · 0.72mm/px · z∈[-80,+63]mm · 10 of 144 slices shown]
[im 1/144]
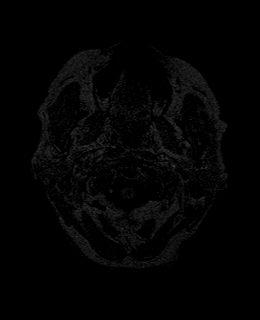
[im 16/144]
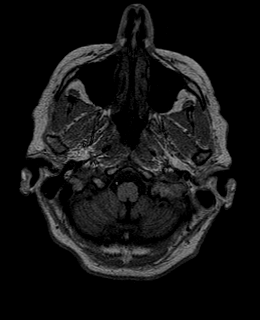
[im 32/144]
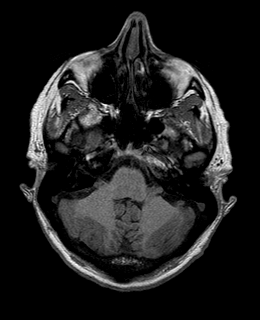
[im 48/144]
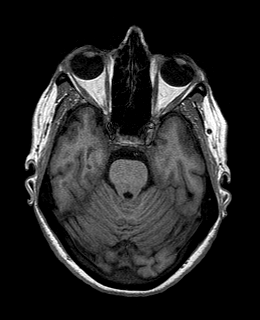
[im 64/144]
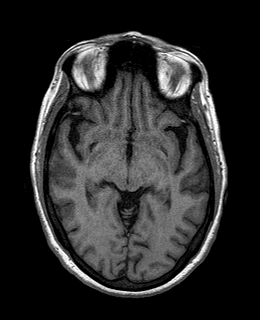
[im 80/144]
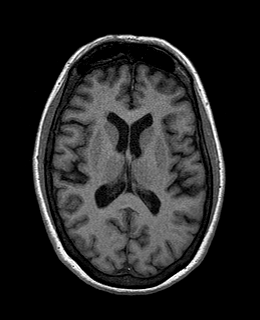
[im 96/144]
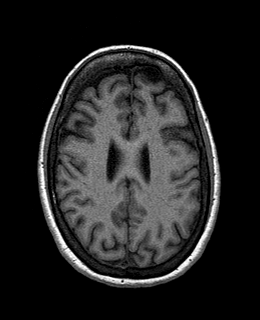
[im 112/144]
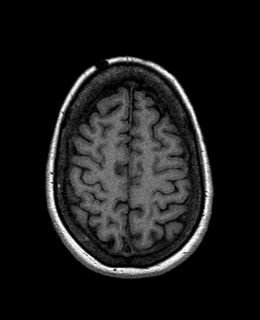
[im 128/144]
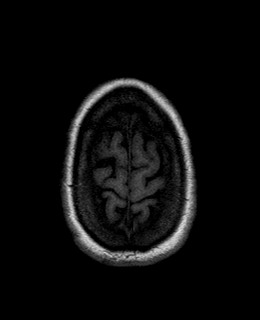
[im 144/144]
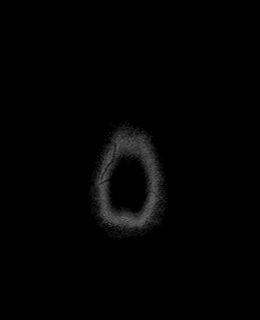

[Series 13: t2_tse_tra · axial · 5.0mm · 0.60mm/px · z∈[-82,+67]mm · 2 of 26 slices shown]
[im 1/26]
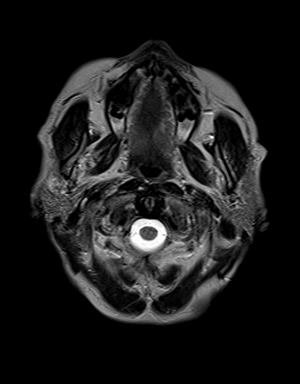
[im 26/26]
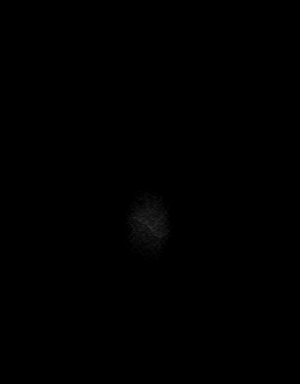

[Series 14: T2 post-contrast · coronal · 5.0mm · 0.45mm/px · 2 of 30 slices shown]
[im 1/30]
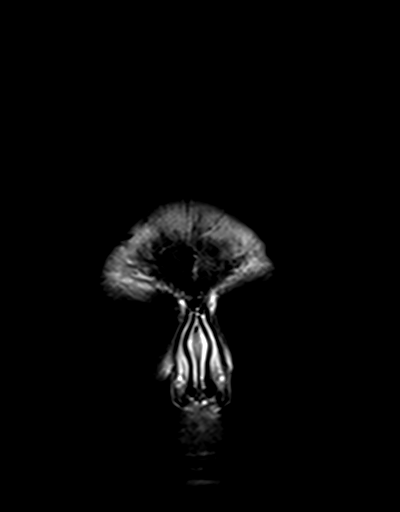
[im 30/30]
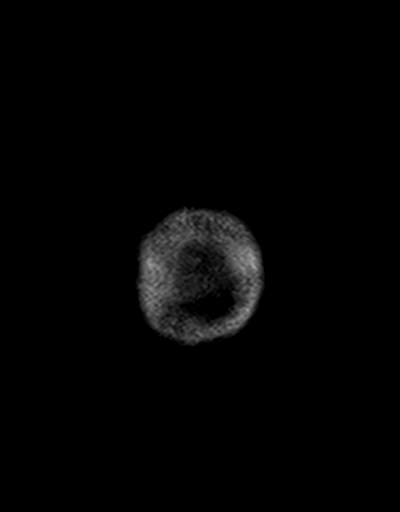

[Series 15: post t1_mpr_tra · axial · 1.0mm · 0.72mm/px · z∈[-80,+63]mm · 10 of 144 slices shown]
[im 1/144]
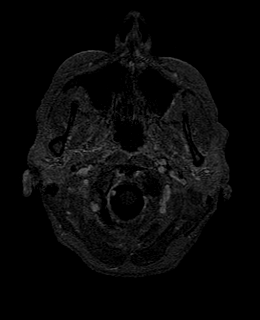
[im 16/144]
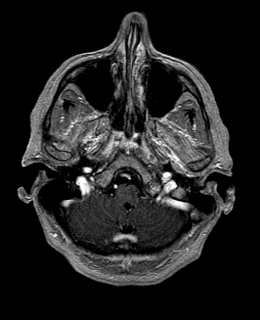
[im 32/144]
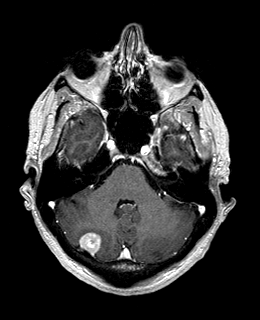
[im 48/144]
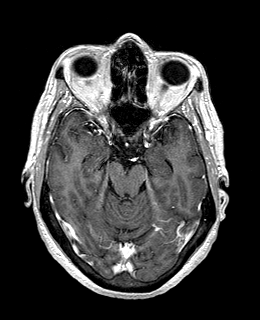
[im 64/144]
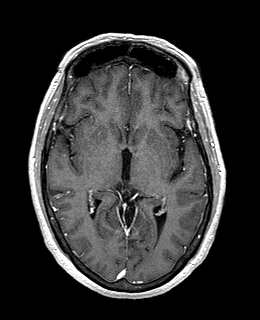
[im 80/144]
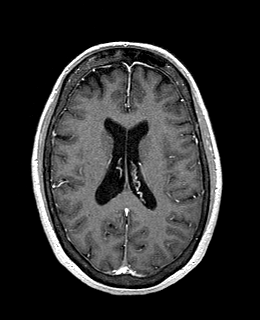
[im 96/144]
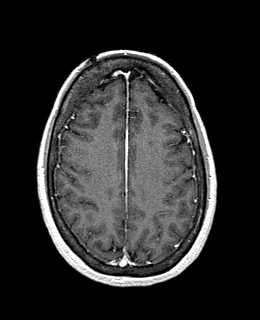
[im 112/144]
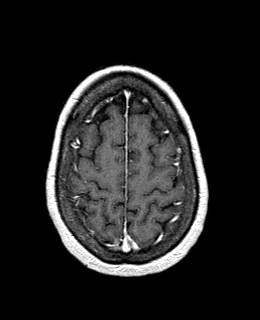
[im 128/144]
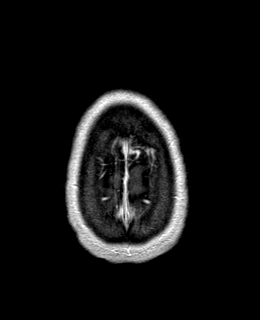
[im 144/144]
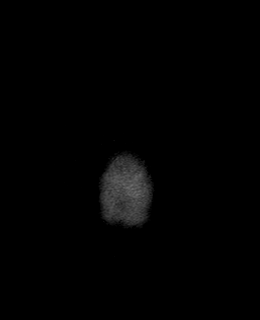

[Series 16: T1 post-contrast · coronal · 5.0mm · 0.72mm/px · 2 of 30 slices shown]
[im 1/30]
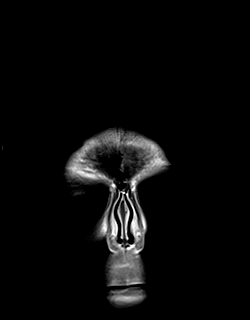
[im 30/30]
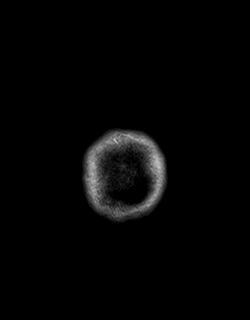

[48 of 48 positions shown; findings below may reference images not displayed]

FINDINGS: Brain: Enhancing extra-axial mass in the posterior fossa on the
right is low signal on T2 and likely has calcification. There is
hyperostosis of the adjacent skull. There is indentation of the
posterior cerebellum on the right. No cerebellar edema. The mass is
similar in size measuring 1.8 x 1.4 x 2.1 cm.

Ventricle size normal. No other mass lesion identified. Negative for
acute or chronic infarct. No hemorrhage or fluid collection.

Vascular: Normal arterial flow voids

Skull and upper cervical spine: Negative

Sinuses/Orbits: Negative

Other: None
IMPRESSION: Right posterior fossa meningioma unchanged in size from 10/20/2013.
No edema identified associated with the lesion.

## 2019-12-15 ENCOUNTER — Telehealth: Payer: Self-pay | Admitting: Internal Medicine

## 2019-12-15 NOTE — Telephone Encounter (Signed)
Noted . Please advise .

## 2019-12-15 NOTE — Telephone Encounter (Signed)
Pt last saw Panosh in 2015. She would like to continue care with Panosh. 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 If possible, she would like to continue care with her.   Pt can be reached at [phone] --ok to leave detailed message per pt  Did inform pt that Panosh is out on vacation until next week. Pt understood.

## 2019-12-18 NOTE — Telephone Encounter (Signed)
So am out of office this week.  I  will not be able to take her on as a new established patient. However. She can establish with another provider and  if needed I can see her for OV   In interim .   WP

## 2019-12-19 NOTE — Telephone Encounter (Signed)
Noted  

## 2019-12-21 NOTE — Telephone Encounter (Signed)
Patient notified of update  and verbalized understanding. 

## 2020-07-19 DIAGNOSIS — F332 Major depressive disorder, recurrent severe without psychotic features: Secondary | ICD-10-CM | POA: Diagnosis not present

## 2020-07-19 DIAGNOSIS — F438 Other reactions to severe stress: Secondary | ICD-10-CM | POA: Diagnosis not present

## 2020-07-25 DIAGNOSIS — F330 Major depressive disorder, recurrent, mild: Secondary | ICD-10-CM | POA: Diagnosis not present

## 2020-07-25 DIAGNOSIS — F900 Attention-deficit hyperactivity disorder, predominantly inattentive type: Secondary | ICD-10-CM | POA: Diagnosis not present

## 2020-08-03 DIAGNOSIS — F332 Major depressive disorder, recurrent severe without psychotic features: Secondary | ICD-10-CM | POA: Diagnosis not present

## 2020-08-03 DIAGNOSIS — F438 Other reactions to severe stress: Secondary | ICD-10-CM | POA: Diagnosis not present

## 2020-08-16 DIAGNOSIS — F332 Major depressive disorder, recurrent severe without psychotic features: Secondary | ICD-10-CM | POA: Diagnosis not present

## 2020-08-16 DIAGNOSIS — F438 Other reactions to severe stress: Secondary | ICD-10-CM | POA: Diagnosis not present

## 2020-09-02 DIAGNOSIS — F438 Other reactions to severe stress: Secondary | ICD-10-CM | POA: Diagnosis not present

## 2020-09-02 DIAGNOSIS — F332 Major depressive disorder, recurrent severe without psychotic features: Secondary | ICD-10-CM | POA: Diagnosis not present

## 2020-09-09 DIAGNOSIS — F332 Major depressive disorder, recurrent severe without psychotic features: Secondary | ICD-10-CM | POA: Diagnosis not present

## 2020-09-09 DIAGNOSIS — F438 Other reactions to severe stress: Secondary | ICD-10-CM | POA: Diagnosis not present

## 2020-09-28 DIAGNOSIS — E669 Obesity, unspecified: Secondary | ICD-10-CM | POA: Diagnosis not present

## 2020-09-28 DIAGNOSIS — R1011 Right upper quadrant pain: Secondary | ICD-10-CM | POA: Diagnosis not present

## 2020-09-28 DIAGNOSIS — Z1331 Encounter for screening for depression: Secondary | ICD-10-CM | POA: Diagnosis not present

## 2020-09-28 DIAGNOSIS — F411 Generalized anxiety disorder: Secondary | ICD-10-CM | POA: Diagnosis not present

## 2020-09-28 DIAGNOSIS — Z23 Encounter for immunization: Secondary | ICD-10-CM | POA: Diagnosis not present

## 2020-09-28 DIAGNOSIS — I10 Essential (primary) hypertension: Secondary | ICD-10-CM | POA: Diagnosis not present

## 2020-09-28 DIAGNOSIS — F988 Other specified behavioral and emotional disorders with onset usually occurring in childhood and adolescence: Secondary | ICD-10-CM | POA: Diagnosis not present

## 2020-09-29 ENCOUNTER — Other Ambulatory Visit: Payer: Self-pay | Admitting: Internal Medicine

## 2020-09-29 DIAGNOSIS — F332 Major depressive disorder, recurrent severe without psychotic features: Secondary | ICD-10-CM | POA: Diagnosis not present

## 2020-09-29 DIAGNOSIS — R1011 Right upper quadrant pain: Secondary | ICD-10-CM

## 2020-09-29 DIAGNOSIS — F438 Other reactions to severe stress: Secondary | ICD-10-CM | POA: Diagnosis not present

## 2020-10-13 ENCOUNTER — Other Ambulatory Visit: Payer: BC Managed Care – PPO

## 2020-10-24 ENCOUNTER — Ambulatory Visit
Admission: RE | Admit: 2020-10-24 | Discharge: 2020-10-24 | Disposition: A | Discharge status: Home / Self Care | Payer: Self-pay | Source: Ambulatory Visit | Attending: Internal Medicine | Admitting: Internal Medicine

## 2020-10-24 DIAGNOSIS — R1011 Right upper quadrant pain: Secondary | ICD-10-CM

## 2020-10-24 DIAGNOSIS — K8020 Calculus of gallbladder without cholecystitis without obstruction: Secondary | ICD-10-CM | POA: Diagnosis not present

## 2020-11-10 DIAGNOSIS — E669 Obesity, unspecified: Secondary | ICD-10-CM | POA: Diagnosis not present

## 2020-11-10 DIAGNOSIS — F411 Generalized anxiety disorder: Secondary | ICD-10-CM | POA: Diagnosis not present

## 2020-11-10 DIAGNOSIS — I10 Essential (primary) hypertension: Secondary | ICD-10-CM | POA: Diagnosis not present

## 2020-11-10 DIAGNOSIS — F5104 Psychophysiologic insomnia: Secondary | ICD-10-CM | POA: Diagnosis not present

## 2020-11-10 DIAGNOSIS — F988 Other specified behavioral and emotional disorders with onset usually occurring in childhood and adolescence: Secondary | ICD-10-CM | POA: Diagnosis not present

## 2020-11-10 DIAGNOSIS — J4520 Mild intermittent asthma, uncomplicated: Secondary | ICD-10-CM | POA: Diagnosis not present

## 2020-12-13 DIAGNOSIS — Z1152 Encounter for screening for COVID-19: Secondary | ICD-10-CM | POA: Diagnosis not present

## 2020-12-13 DIAGNOSIS — R0981 Nasal congestion: Secondary | ICD-10-CM | POA: Diagnosis not present

## 2020-12-13 DIAGNOSIS — J189 Pneumonia, unspecified organism: Secondary | ICD-10-CM | POA: Diagnosis not present

## 2020-12-13 DIAGNOSIS — R059 Cough, unspecified: Secondary | ICD-10-CM | POA: Diagnosis not present

## 2020-12-13 DIAGNOSIS — J029 Acute pharyngitis, unspecified: Secondary | ICD-10-CM | POA: Diagnosis not present

## 2020-12-16 DIAGNOSIS — Z1231 Encounter for screening mammogram for malignant neoplasm of breast: Secondary | ICD-10-CM | POA: Diagnosis not present

## 2021-01-19 DIAGNOSIS — H6521 Chronic serous otitis media, right ear: Secondary | ICD-10-CM | POA: Diagnosis not present

## 2021-01-19 DIAGNOSIS — H6983 Other specified disorders of Eustachian tube, bilateral: Secondary | ICD-10-CM | POA: Diagnosis not present

## 2021-01-19 DIAGNOSIS — J3489 Other specified disorders of nose and nasal sinuses: Secondary | ICD-10-CM | POA: Diagnosis not present

## 2021-01-19 DIAGNOSIS — H9071 Mixed conductive and sensorineural hearing loss, unilateral, right ear, with unrestricted hearing on the contralateral side: Secondary | ICD-10-CM | POA: Diagnosis not present

## 2021-01-19 DIAGNOSIS — M2669 Other specified disorders of temporomandibular joint: Secondary | ICD-10-CM | POA: Diagnosis not present

## 2021-01-19 DIAGNOSIS — H9011 Conductive hearing loss, unilateral, right ear, with unrestricted hearing on the contralateral side: Secondary | ICD-10-CM | POA: Diagnosis not present

## 2021-01-19 DIAGNOSIS — H6981 Other specified disorders of Eustachian tube, right ear: Secondary | ICD-10-CM | POA: Diagnosis not present

## 2021-06-16 ENCOUNTER — Ambulatory Visit: Payer: Self-pay | Admitting: Surgery

## 2021-06-16 DIAGNOSIS — K8050 Calculus of bile duct without cholangitis or cholecystitis without obstruction: Secondary | ICD-10-CM | POA: Diagnosis not present

## 2021-06-16 NOTE — H&P (Signed)
Margaret Oconnor [id number]   Referring Provider:  Cristie Hem   Subjective   Chief Complaint: No chief complaint on file.     History of Present Illness:    This is a pleasant 65 year old woman who presents with symptomatic biliary colic.  She has been struggling with this since at least January of this year, when she had an ultrasound demonstrating gallstones without sonographic evidence of cholecystitis.  Common bile duct was 5 mm at that time, liver was noted to be echogenic likely fatty infiltration but appearance that could be seen with cirrhosis and certain infiltrative disorders, no hepatic mass or nodularity, normal portal flow. She continues to have right flank pain and this is on a daily basis.  Some associated nausea but she is eating well and reports her bowel movements are fairly regular. Denies any previous abdominal surgery   Review of Systems: A complete review of systems was obtained from the patient.  I have reviewed this information and discussed as appropriate with the patient.  See HPI as well for other ROS.   Medical History: Past Medical History:  Diagnosis Date   Anxiety    Arthritis    GERD (gastroesophageal reflux disease)     There is no problem list on file for this patient.   History reviewed. No pertinent surgical history.   No Known Allergies  Current Outpatient Medications on File Prior to Visit  Medication Sig Dispense Refill   lisdexamfetamine (VYVANSE) 70 MG capsule 1 capsule in the morning     omeprazole (PRILOSEC) 40 MG DR capsule Take 40 mg by mouth once daily     valsartan (DIOVAN) 80 MG tablet 1 tablet     albuterol 90 mcg/actuation inhaler Inhale 2 inhalations into the lungs every 4 (four) hours as needed     sertraline (ZOLOFT) 100 MG tablet Take 100 mg by mouth once daily     traZODone (DESYREL) 100 MG tablet Take by mouth     No current facility-administered medications on file prior to visit.    Family History  Problem  Relation Age of Onset   High blood pressure (Hypertension) Mother    Breast cancer Mother      Social History   Tobacco Use  Smoking Status Former Smoker   Quit date: 1985   Years since quitting: 37.7  Smokeless Tobacco Never Used     Social History   Socioeconomic History   Marital status: Unknown  Tobacco Use   Smoking status: Former Smoker    Quit date: 1985    Years since quitting: 37.7   Smokeless tobacco: Never Used  Scientific laboratory technician Use: Never used  Substance and Sexual Activity   Alcohol use: Yes   Drug use: Defer   Sexual activity: Defer    Objective:    Vitals:   06/16/21 1537  BP: (!) 144/80  Pulse: 94  Temp: 36.3 C (97.4 F)  SpO2: 99%  Weight: 95.8 kg (211 lb 3.2 oz)  Height: 154.9 cm (5\' 1" )    Body mass index is 39.91 kg/m.  Alert, well-appearing Unlabored respirations Abdomen is obese/protuberant, minimally tender along the right upper quadrant.  No caput medusa or fluid wave.  Assessment and Plan:  Diagnoses and all orders for this visit:  Biliary colic    I recommend proceeding with laparoscopic or robotic cholecystectomy with possible cholangiogram.  Discussed the relevant anatomy using a diagram to demonstrate, and went over surgical technique.  Discussed risks of surgery including  bleeding, pain, scarring, intraabdominal injury specifically to the common bile duct and sequelae, bile leak, conversion to open surgery, failure to resolve symptoms, blood clots/ pulmonary embolus, heart attack, pneumonia, stroke, death. Questions welcomed and answered to patient's satisfaction.    Lynsay Fesperman Raquel James, MD  .

## 2021-06-23 NOTE — Patient Instructions (Signed)
DUE TO COVID-19 ONLY ONE VISITOR IS ALLOWED TO COME WITH YOU AND STAY IN THE WAITING ROOM ONLY DURING PRE OP AND PROCEDURE.   **NO VISITORS ARE ALLOWED IN THE SHORT STAY AREA OR RECOVERY ROOM!!**  IF YOU WILL BE ADMITTED INTO THE HOSPITAL YOU ARE ALLOWED ONLY TWO SUPPORT PEOPLE DURING VISITATION HOURS ONLY (10AM -8PM)   The support person(s) may change daily. The support person(s) must pass our screening, gel in and out, and wear a mask at all times, including in the patient's room. Patients must also wear a mask when staff or their support person are in the room.  No visitors under the age of 7. Any visitor under the age of 45 must be accompanied by an adult.        Your procedure is scheduled on: 06/30/21   Report to [facility] Main Entrance    Report to admitting at : 7:15 AM   Call this number if you have problems the morning of surgery [phone]   Do not eat food :After Midnight.   May have liquids until: 6:30 AM    day of surgery  CLEAR LIQUID DIET  Foods Allowed                                                                     Foods Excluded  Water, Black Coffee and tea, regular and decaf                             liquids that you cannot  Plain Jell-O in any flavor  (No red)                                           see through such as: Fruit ices (not with fruit pulp)                                     milk, soups, orange juice              Iced Popsicles (No red)                                    All solid food                                   Apple juices Sports drinks like Gatorade (No red) Lightly seasoned clear broth or consume(fat free) Sugar,   Sample Menu Breakfast                                Lunch                                     Supper Cranberry juice  Beef broth                            Chicken broth Jell-O                                     Grape juice                           Apple juice Coffee or tea                         Jell-O                                      Popsicle                                                Coffee or tea                        Coffee or tea      Oral Hygiene is also important to reduce your risk of infection.                                    Remember - BRUSH YOUR TEETH THE MORNING OF SURGERY WITH YOUR REGULAR TOOTHPASTE   Do NOT smoke after Midnight   Take these medicines the morning of surgery with A SIP OF WATER: sertraline,omeprazole.  DO NOT TAKE ANY ORAL DIABETIC MEDICATIONS DAY OF YOUR SURGERY                              You may not have any metal on your body including hair pins, jewelry, and body piercing             Do not wear make-up, lotions, powders, perfumes/cologne, or deodorant  Do not wear nail polish including gel and S&S, artificial/acrylic nails, or any other type of covering on natural nails including finger and toenails. If you have artificial nails, gel coating, etc. that needs to be removed by a nail salon please have this removed prior to surgery or surgery may need to be canceled/ delayed if the surgeon/ anesthesia feels like they are unable to be safely monitored.   Do not shave  48 hours prior to surgery.    Do not bring valuables to the hospital. [place].   Contacts, dentures or bridgework may not be worn into surgery.   Bring small overnight bag day of surgery.    Patients discharged on the day of surgery will not be allowed to drive home.   Special Instructions: Bring a copy of your healthcare power of attorney and living will documents         the day of surgery if you haven't scanned them before.  Please read over the following fact sheets you were given: IF YOU HAVE QUESTIONS ABOUT YOUR PRE-OP INSTRUCTIONS PLEASE CALL [phone]   [facility] - Preparing for Surgery Before surgery, you can play an important role.  Because skin is not sterile, your skin  needs to be as free of germs as possible.  You can reduce the number of germs on your skin by washing with CHG (chlorahexidine gluconate) soap before surgery.  CHG is an antiseptic cleaner which kills germs and bonds with the skin to continue killing germs even after washing. Please DO NOT use if you have an allergy to CHG or antibacterial soaps.  If your skin becomes reddened/irritated stop using the CHG and inform your nurse when you arrive at Short Stay. Do not shave (including legs and underarms) for at least 48 hours prior to the first CHG shower.  You may shave your face/neck. Please follow these instructions carefully:  1.  Shower with CHG Soap the night before surgery and the  morning of Surgery.  2.  If you choose to wash your hair, wash your hair first as usual with your  normal  shampoo.  3.  After you shampoo, rinse your hair and body thoroughly to remove the  shampoo.                           4.  Use CHG as you would any other liquid soap.  You can apply chg directly  to the skin and wash                       Gently with a scrungie or clean washcloth.  5.  Apply the CHG Soap to your body ONLY FROM THE NECK DOWN.   Do not use on face/ open                           Wound or open sores. Avoid contact with eyes, ears mouth and genitals (private parts).                       Wash face,  Genitals (private parts) with your normal soap.             6.  Wash thoroughly, paying special attention to the area where your surgery  will be performed.  7.  Thoroughly rinse your body with warm water from the neck down.  8.  DO NOT shower/wash with your normal soap after using and rinsing off  the CHG Soap.                9.  Pat yourself dry with a clean towel.            10.  Wear clean pajamas.            11.  Place clean sheets on your bed the night of your first shower and do not  sleep with pets. Day of Surgery : Do not apply any lotions/deodorants the morning of surgery.  Please wear clean  clothes to the hospital/surgery center.  FAILURE TO FOLLOW THESE INSTRUCTIONS MAY RESULT IN THE CANCELLATION OF YOUR SURGERY PATIENT SIGNATURE_________________________________  NURSE SIGNATURE__________________________________  ________________________________________________________________________

## 2021-06-26 ENCOUNTER — Encounter (HOSPITAL_COMMUNITY)
Admission: RE | Admit: 2021-06-26 | Discharge: 2021-06-26 | Disposition: A | Discharge status: Home / Self Care | Payer: PPO | Source: Ambulatory Visit | Attending: Anesthesiology | Admitting: Anesthesiology

## 2021-06-29 ENCOUNTER — Other Ambulatory Visit (HOSPITAL_COMMUNITY): Payer: PPO

## 2021-06-30 ENCOUNTER — Ambulatory Visit: Admit: 2021-06-30 | Payer: PPO | Admitting: Surgery

## 2021-06-30 SURGERY — LAPAROSCOPIC CHOLECYSTECTOMY
Anesthesia: General

## 2021-09-06 DIAGNOSIS — R6883 Chills (without fever): Secondary | ICD-10-CM | POA: Diagnosis not present

## 2021-09-06 DIAGNOSIS — J302 Other seasonal allergic rhinitis: Secondary | ICD-10-CM | POA: Diagnosis not present

## 2021-09-06 DIAGNOSIS — Z1152 Encounter for screening for COVID-19: Secondary | ICD-10-CM | POA: Diagnosis not present

## 2021-09-06 DIAGNOSIS — J4520 Mild intermittent asthma, uncomplicated: Secondary | ICD-10-CM | POA: Diagnosis not present

## 2021-09-06 DIAGNOSIS — R051 Acute cough: Secondary | ICD-10-CM | POA: Diagnosis not present

## 2021-09-06 DIAGNOSIS — R5383 Other fatigue: Secondary | ICD-10-CM | POA: Diagnosis not present

## 2021-09-06 DIAGNOSIS — R0981 Nasal congestion: Secondary | ICD-10-CM | POA: Diagnosis not present

## 2021-11-08 ENCOUNTER — Other Ambulatory Visit (HOSPITAL_COMMUNITY): Payer: Self-pay | Admitting: Gastroenterology

## 2021-11-08 DIAGNOSIS — R1084 Generalized abdominal pain: Secondary | ICD-10-CM

## 2021-11-16 ENCOUNTER — Encounter (HOSPITAL_COMMUNITY): Payer: Self-pay

## 2021-11-16 ENCOUNTER — Other Ambulatory Visit (HOSPITAL_COMMUNITY): Payer: PPO

## 2021-12-06 ENCOUNTER — Ambulatory Visit (HOSPITAL_COMMUNITY): Admission: RE | Admit: 2021-12-06 | Payer: PPO | Source: Ambulatory Visit

## 2021-12-13 ENCOUNTER — Ambulatory Visit (HOSPITAL_COMMUNITY): Payer: PPO

## 2022-02-20 ENCOUNTER — Ambulatory Visit: Payer: PPO | Admitting: Internal Medicine

## 2022-02-20 NOTE — Progress Notes (Deleted)
Cardiology Office Note:    Date:  02/20/2022   ID:  Margaret Oconnor, DOB 1956-07-19, MRN [id number]  PCP:  No primary care provider on file.   [facility] Providers Cardiologist:  None { Click to update primary MD,subspecialty MD or APP then REFRESH:1}    Referring MD: Michael Boston, MD   No chief complaint on file. HTN  History of Present Illness:    Margaret Oconnor is a 65 y.o. female with a hx of ADHD on vyvanse, ?cardiac murmur, HLD (atherosclerosis noted on cxray), HTN referral from [facility].    Past Medical History:  Diagnosis Date   ADHD (attention deficit hyperactivity disorder)    Allergy    Anaphylaxis 2002   due to insect stings   Benign meningioma ([place])    Nudelman 1 cm r post fossa stable   Hx of stress fx 2002   r tibia   IBS (irritable bowel syndrome)    Leg length discrepancy    Meningioma ([facility])    sacn stable no signs 1 cm rt posterior fossa 2003 dx when eval for right sided numbness   Substance addiction ([place])    recovering    Past Surgical History:  Procedure Laterality Date   stress fracture  11/2000   rt tibia    Current Medications: No outpatient medications have been marked as taking for the 02/20/22 encounter (Appointment) with Janina Mayo, MD.     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   Not smoking           G6P3   Social Determinants of Health   Financial Resource Strain: Not on file  Food Insecurity: Not on file  Transportation Needs: Not on file  Physical Activity: Not on file  Stress: Not on file  Social Connections: Not on file     Family History: The patient's ***family history includes Breast cancer in her mother; Hodgkin's lymphoma in her father.  ROS:   Please see the history of present illness.    *** All other systems reviewed and are negative.  EKGs/Labs/Other Studies Reviewed:    The following studies were reviewed today: ***  EKG:  EKG is *** ordered today.  The ekg ordered today demonstrates ***  Recent Labs: No results found for requested labs within last 8760 hours.  Recent Lipid Panel    Component Value Date/Time   CHOL 260 (H) 03/24/2014 1529   TRIG 104.0 03/24/2014 1529   HDL 125.10 03/24/2014 1529   CHOLHDL 2 03/24/2014 1529   VLDL 20.8 03/24/2014 1529   LDLCALC 114 (H) 03/24/2014 1529     Risk Assessment/Calculations:   {Does this patient have ATRIAL FIBRILLATION?:[phone]}  Physical Exam:    VS:  There were no vitals taken for this visit.    Wt Readings from Last 3 Encounters:  06/23/15 126 lb 12.8 oz (57.5 kg)  04/15/14 157 lb 3.2 oz (71.3 kg)  02/22/12 160 lb (72.6 kg)     GEN: *** Well nourished, well developed in no acute distress HEENT: Normal NECK: No JVD; No carotid bruits LYMPHATICS: No lymphadenopathy CARDIAC: ***RRR, no murmurs, rubs, gallops RESPIRATORY:  Clear to auscultation without rales, wheezing or rhonchi  ABDOMEN: Soft, non-tender, non-distended MUSCULOSKELETAL:  No edema; No deformity  SKIN: Warm and dry NEUROLOGIC:  Alert and oriented x 3 PSYCHIATRIC:  Normal affect   ASSESSMENT:    HTN: continue valsartan 80 mg daily. PLAN:    In order of problems listed above:  *** lipid panel      {Are you ordering a CV Procedure (e.g. stress test, cath, DCCV, TEE, etc)?   Press F2        :[id number]}     Medication Adjustments/Labs and Tests Ordered: Current medicines are reviewed at length with the patient today.  Concerns regarding medicines are outlined above.  No orders of the defined types were placed in this encounter.  No orders of the defined types were placed in this encounter.   There are no Patient Instructions on file for this visit.   Signed, Janina Mayo, MD  02/20/2022 10:34 AM    [facility]

## 2022-02-22 ENCOUNTER — Ambulatory Visit (HOSPITAL_COMMUNITY): Payer: PPO

## 2022-03-01 ENCOUNTER — Ambulatory Visit (HOSPITAL_COMMUNITY): Payer: PPO

## 2022-03-02 ENCOUNTER — Ambulatory Visit (HOSPITAL_COMMUNITY): Payer: PPO

## 2022-03-06 ENCOUNTER — Ambulatory Visit: Payer: PPO | Admitting: Internal Medicine

## 2022-03-16 ENCOUNTER — Ambulatory Visit: Payer: PPO | Admitting: Internal Medicine

## 2022-03-18 NOTE — Progress Notes (Deleted)
Cardiology Office Note   Date:  03/18/2022   ID:  Margaret Oconnor, DOB 12/14/1955, MRN [id number]  PCP:  Michael Boston, MD  Cardiologist:   Davy Faught [place], MD   No chief complaint on file.     History of Present Illness: Margaret Oconnor is a 66 y.o. female who is seen at the request of Dr Jacalyn Lefevre for evaluation of heart murmur and atherosclerosis noted on Xray.     Past Medical History:  Diagnosis Date   ADHD (attention deficit hyperactivity disorder)    Allergy    Anaphylaxis 2002   due to insect stings   Benign meningioma ([place])    Nudelman 1 cm r post fossa stable   Hx of stress fx 2002   r tibia   IBS (irritable bowel syndrome)    Leg length discrepancy    Meningioma (HCC)    sacn stable no signs 1 cm rt posterior fossa 2003 dx when eval for right sided numbness   Substance addiction ([place])    recovering    Past Surgical History:  Procedure Laterality Date   stress fracture  11/2000   rt tibia     Current Outpatient Medications  Medication Sig Dispense Refill   ADVAIR HFA 115-21 MCG/ACT inhaler Inhale 2 puffs into the lungs 2 (two) times daily as needed for shortness of breath.     albendazole (ALBENZA) 200 MG tablet Take 200 mg by mouth 2 (two) times daily as needed (infection).     albuterol (VENTOLIN HFA) 108 (90 Base) MCG/ACT inhaler Inhale 2 puffs into the lungs every 6 (six) hours as needed for shortness of breath.     Aspirin-Salicylamide-Caffeine (BC HEADACHE POWDER PO) Take 1 Package by mouth daily as needed (headache).     EPINEPHrine (EPI-PEN) 0.3 mg/0.3 mL DEVI Inject 0.3 mLs (0.3 mg total) into the muscle once. (Patient not taking: No sig reported) 2 Device 0   ibuprofen (ADVIL) 600 MG tablet Take 600 mg by mouth every 6 (six) hours as needed for moderate pain.     ivermectin (STROMECTOL) 3 MG TABS tablet Take 6 mg by mouth 2 (two) times daily as needed (infection).     lisdexamfetamine (VYVANSE) 70 MG capsule Take 1 capsule (70 mg total) by mouth every  morning. Fill on or after '7 30 13 30 '$ capsule 0   omeprazole (PRILOSEC OTC) 20 MG tablet Take 20 mg by mouth daily as needed (acid reflux).     sertraline (ZOLOFT) 100 MG tablet Take 200 mg by mouth daily.     trazodone (DESYREL) 300 MG tablet Take 300 mg by mouth at bedtime.     valsartan (DIOVAN) 80 MG tablet Take 80 mg by mouth daily.     No current facility-administered medications for this visit.    Allergies:   Bee venom    Social History:  The patient  reports that she has quit smoking. She does not have any smokeless tobacco history on file. She reports that she does not drink alcohol and does not use drugs.   Family History:  The patient's ***family history includes Breast cancer in her mother; Hodgkin's lymphoma in her father.    ROS:  Please see the history of present illness.   Otherwise, review of systems are positive for {NONE DEFAULTED:18576}.   All other systems are reviewed and negative.    PHYSICAL EXAM: VS:  There were no vitals taken for this visit. , BMI There is no height or weight on  file to calculate BMI. GEN: Well nourished, well developed, in no acute distress HEENT: normal Neck: no JVD, carotid bruits, or masses Cardiac: ***RRR; no murmurs, rubs, or gallops,no edema  Respiratory:  clear to auscultation bilaterally, normal work of breathing GI: soft, nontender, nondistended, + BS MS: no deformity or atrophy Skin: warm and dry, no rash Neuro:  Strength and sensation are intact Psych: euthymic mood, full affect   EKG:  EKG {ACTION; IS/IS FXT:[id number]} ordered today. The ekg ordered today demonstrates ***   Recent Labs: No results found for requested labs within last 365 days.    Lipid Panel    Component Value Date/Time   CHOL 260 (H) 03/24/2014 1529   TRIG 104.0 03/24/2014 1529   HDL 125.10 03/24/2014 1529   CHOLHDL 2 03/24/2014 1529   VLDL 20.8 03/24/2014 1529   LDLCALC 114 (H) 03/24/2014 1529   Dated 07/10/21: cholesterol 260, triglycerides  133, HDL 81, LDL 152. Normal TSH Dated 11/08/21: normal CMET  Wt Readings from Last 3 Encounters:  06/23/15 126 lb 12.8 oz (57.5 kg)  04/15/14 157 lb 3.2 oz (71.3 kg)  02/22/12 160 lb (72.6 kg)      Other studies Reviewed: Additional studies/ records that were reviewed today include: ***. Review of the above records demonstrates: ***   ASSESSMENT AND PLAN:  1.  ***   Current medicines are reviewed at length with the patient today.  The patient {ACTIONS; HAS/DOES NOT HAVE:19233} concerns regarding medicines.  The following changes have been made:  {PLAN; NO CHANGE:13088:s}  Labs/ tests ordered today include: *** No orders of the defined types were placed in this encounter.        Disposition:   FU with *** in {gen number 3-29:924268} {Days to years:10300}  Signed, Jacy Brocker [place], MD  03/18/2022 1:30 PM    [facility] [address] [place], [place], [address] Phone [phone], Fax [phone]

## 2022-03-21 ENCOUNTER — Ambulatory Visit: Payer: PPO | Admitting: Cardiology

## 2022-04-05 ENCOUNTER — Ambulatory Visit (HOSPITAL_COMMUNITY): Payer: PPO

## 2022-04-10 ENCOUNTER — Ambulatory Visit (HOSPITAL_COMMUNITY): Payer: PPO

## 2022-04-11 ENCOUNTER — Ambulatory Visit (HOSPITAL_COMMUNITY)
Admission: RE | Admit: 2022-04-11 | Discharge: 2022-04-11 | Disposition: A | Discharge status: Home / Self Care | Payer: PPO | Source: Ambulatory Visit | Attending: Gastroenterology | Admitting: Gastroenterology

## 2022-04-11 DIAGNOSIS — R1084 Generalized abdominal pain: Secondary | ICD-10-CM | POA: Diagnosis present

## 2022-04-11 LAB — POCT I-STAT CREATININE: Creatinine, Ser: 1.1 mg/dL — ABNORMAL HIGH (ref 0.44–1.00)

## 2022-04-11 MED ORDER — IOHEXOL 300 MG/ML  SOLN
100.0000 mL | Freq: Once | INTRAMUSCULAR | Status: AC | PRN
  Administered 2022-04-11: 100 mL via INTRAVENOUS

## 2022-04-13 ENCOUNTER — Ambulatory Visit (HOSPITAL_COMMUNITY)
Admission: RE | Admit: 2022-04-13 | Discharge: 2022-04-13 | Disposition: A | Discharge status: Home / Self Care | Payer: PPO | Source: Ambulatory Visit | Attending: Internal Medicine | Admitting: Internal Medicine

## 2022-04-13 ENCOUNTER — Other Ambulatory Visit (HOSPITAL_COMMUNITY): Payer: Self-pay | Admitting: Internal Medicine

## 2022-04-13 ENCOUNTER — Other Ambulatory Visit: Payer: Self-pay | Admitting: Internal Medicine

## 2022-04-13 DIAGNOSIS — R911 Solitary pulmonary nodule: Secondary | ICD-10-CM | POA: Insufficient documentation

## 2022-07-02 ENCOUNTER — Other Ambulatory Visit (HOSPITAL_BASED_OUTPATIENT_CLINIC_OR_DEPARTMENT_OTHER): Payer: Self-pay | Admitting: Internal Medicine

## 2022-07-02 DIAGNOSIS — R911 Solitary pulmonary nodule: Secondary | ICD-10-CM

## 2022-07-11 ENCOUNTER — Ambulatory Visit (HOSPITAL_BASED_OUTPATIENT_CLINIC_OR_DEPARTMENT_OTHER): Payer: PPO

## 2022-07-13 ENCOUNTER — Ambulatory Visit (HOSPITAL_BASED_OUTPATIENT_CLINIC_OR_DEPARTMENT_OTHER): Payer: PPO

## 2022-07-18 ENCOUNTER — Ambulatory Visit (HOSPITAL_COMMUNITY): Payer: PPO

## 2022-07-19 ENCOUNTER — Other Ambulatory Visit (HOSPITAL_BASED_OUTPATIENT_CLINIC_OR_DEPARTMENT_OTHER): Payer: PPO

## 2022-07-23 ENCOUNTER — Other Ambulatory Visit (HOSPITAL_COMMUNITY): Payer: Self-pay | Admitting: Internal Medicine

## 2022-07-23 DIAGNOSIS — R911 Solitary pulmonary nodule: Secondary | ICD-10-CM

## 2022-07-28 ENCOUNTER — Emergency Department (HOSPITAL_COMMUNITY): Payer: PPO

## 2022-07-28 ENCOUNTER — Encounter (HOSPITAL_COMMUNITY): Payer: Self-pay | Admitting: Emergency Medicine

## 2022-07-28 ENCOUNTER — Emergency Department (HOSPITAL_COMMUNITY)
Admission: EM | Admit: 2022-07-28 | Discharge: 2022-07-28 | Disposition: A | Discharge status: Home / Self Care | Payer: PPO | Attending: Emergency Medicine | Admitting: Emergency Medicine

## 2022-07-28 ENCOUNTER — Encounter (HOSPITAL_COMMUNITY): Payer: Self-pay

## 2022-07-28 ENCOUNTER — Other Ambulatory Visit: Payer: Self-pay

## 2022-07-28 ENCOUNTER — Ambulatory Visit (HOSPITAL_COMMUNITY)
Admission: RE | Admit: 2022-07-28 | Discharge: 2022-07-28 | Disposition: A | Discharge status: Home / Self Care | Payer: PPO | Source: Ambulatory Visit | Attending: Internal Medicine | Admitting: Internal Medicine

## 2022-07-28 DIAGNOSIS — R0602 Shortness of breath: Secondary | ICD-10-CM | POA: Insufficient documentation

## 2022-07-28 DIAGNOSIS — R079 Chest pain, unspecified: Secondary | ICD-10-CM | POA: Diagnosis present

## 2022-07-28 DIAGNOSIS — Z7982 Long term (current) use of aspirin: Secondary | ICD-10-CM | POA: Diagnosis not present

## 2022-07-28 DIAGNOSIS — R911 Solitary pulmonary nodule: Secondary | ICD-10-CM

## 2022-07-28 NOTE — ED Provider Notes (Signed)
[facility] DEPT Provider Note   CSN: [id number] Arrival date & time: 07/28/22  1622     History  Chief Complaint  Patient presents with   Shortness of Breath   Chest Pain    Margaret Oconnor is a 66 y.o. female.  HPI   Patient has a history of ureteral bowel syndrome, ADHD, meningioma and has been having issues with recurrent abdominal bloating as well as chest pain.  Patient states for the last couple weeks she has noticed intermittent chest pain.  Patient states that seems to be worse in the morning.  She does get short of breath when it occurs and it goes to her back.  Patient denies any fever.  She does not have a history of heart disease as far she knows of.  Patient states she saw a GI doctor for her abdominal issues.  She had CT scans on July 19 and 21st.  She had a CT scan her abdomen pelvis as well as a CT chest without contrast because of some incidental findings noted on the abdominal CT.  The abdominal CT showed diffuse hepatic steatosis.  Otherwise she had an irregular right pulmonary nodule.  Patient had a follow-up CT scan that showed nodules in the right lung.  Repeat chest CT was recommended in 3 to 6 months.  Patient was going to have her outpatient CT scan today.  She mentioned to the radiology staff the chest discomfort she was having.  They recommended she come to the emergency room for evaluation Home Medications Prior to Admission medications   Medication Sig Start Date End Date Taking? Authorizing Provider  ADVAIR [facility] 115-21 MCG/ACT inhaler Inhale 2 puffs into the lungs 2 (two) times daily as needed for shortness of breath. 03/19/21   [provider]  albendazole (ALBENZA) 200 MG tablet Take 200 mg by mouth 2 (two) times daily as needed (infection).    [provider]  albuterol (VENTOLIN HFA) 108 (90 Base) MCG/ACT inhaler Inhale 2 puffs into the lungs every 6 (six) hours as needed for shortness of breath. 05/04/19    [provider]  Aspirin-Salicylamide-Caffeine (BC HEADACHE POWDER PO) Take 1 Package by mouth daily as needed (headache).    [provider]  EPINEPHrine (EPI-PEN) 0.3 mg/0.3 mL DEVI Inject 0.3 mLs (0.3 mg total) into the muscle once. Patient not taking: No sig reported 01/27/13   Panosh, Standley Brooking, MD  ibuprofen (ADVIL) 600 MG tablet Take 600 mg by mouth every 6 (six) hours as needed for moderate pain.    [provider]  ivermectin (STROMECTOL) 3 MG TABS tablet Take 6 mg by mouth 2 (two) times daily as needed (infection). 06/16/21   [provider]  lisdexamfetamine (VYVANSE) 70 MG capsule Take 1 capsule (70 mg total) by mouth every morning. Fill on or after '7 30 13 '$ 02/22/12   Panosh, Standley Brooking, MD  omeprazole (PRILOSEC OTC) 20 MG tablet Take 20 mg by mouth daily as needed (acid reflux).    [provider]  sertraline (ZOLOFT) 100 MG tablet Take 200 mg by mouth daily.    [provider]  trazodone (DESYREL) 300 MG tablet Take 300 mg by mouth at bedtime.    [provider]  valsartan (DIOVAN) 80 MG tablet Take 80 mg by mouth daily.    [provider]      Allergies    Bee venom    Review of Systems   Review of Systems  Physical Exam  Updated Vital Signs BP (!) 152/84   Pulse 68   Resp 10   SpO2 96%  Physical Exam Vitals and nursing note reviewed.  Constitutional:      General: She is not in acute distress.    Appearance: She is well-developed.  HENT:     Head: Normocephalic and atraumatic.     Right Ear: External ear normal.     Left Ear: External ear normal.  Eyes:     General: No scleral icterus.       Right eye: No discharge.        Left eye: No discharge.     Conjunctiva/sclera: Conjunctivae normal.  Neck:     Trachea: No tracheal deviation.  Cardiovascular:     Rate and Rhythm: Normal rate and regular rhythm.  Pulmonary:     Effort: Pulmonary effort is normal. No respiratory distress.     Breath sounds:  Normal breath sounds. No stridor. No wheezing or rales.  Abdominal:     General: Bowel sounds are normal. There is no distension.     Palpations: Abdomen is soft.     Tenderness: There is no abdominal tenderness. There is no guarding or rebound.     Comments: Protuberant abdomen  Musculoskeletal:        General: No tenderness or deformity.     Cervical back: Neck supple.  Skin:    General: Skin is warm and dry.     Findings: No rash.     Comments: Few areas of excoriation noted on the abdominal wall, few lesions noted on the legs, no pustules, no petechiae  Neurological:     General: No focal deficit present.     Mental Status: She is alert.     Cranial Nerves: No cranial nerve deficit (no facial droop, extraocular movements intact, no slurred speech).     Sensory: No sensory deficit.     Motor: No abnormal muscle tone or seizure activity.     Coordination: Coordination normal.  Psychiatric:        Mood and Affect: Mood normal.     ED Results / Procedures / Treatments   Labs (all labs ordered are listed, but only abnormal results are displayed) Labs Reviewed  BASIC METABOLIC PANEL  CBC  D-DIMER, QUANTITATIVE  CBG MONITORING, ED  TROPONIN I (HIGH SENSITIVITY)    EKG EKG Interpretation  Date/Time:  Saturday July 28 2022 16:34:31 EDT Ventricular Rate:  68 PR Interval:  141 QRS Duration: 89 QT Interval:  417 QTC Calculation: 444 R Axis:   30 Text Interpretation: Sinus rhythm Low voltage, precordial leads Abnormal R-wave progression, early transition Since last tracing rate faster Confirmed by Dorie Rank [id number]) on 07/28/2022 6:08:51 PM  Radiology DG Chest Portable 1 View  Result Date: 07/28/2022 CLINICAL DATA:  Chest pain EXAM: PORTABLE CHEST 1 VIEW COMPARISON:  CT chest done on 04/13/2022 FINDINGS: The heart size and mediastinal contours are within normal limits. Both lungs are clear. Degenerative changes are noted in both shoulders. IMPRESSION: No active disease.  Electronically Signed   By: Elmer Picker M.D.   On: 07/28/2022 17:22    Procedures Procedures    Medications Ordered in ED Medications - No data to display  ED Course/ Medical Decision Making/ A&P Clinical Course as of 07/28/22 1814  Sat Jul 28, 2022  1810 Chest x-ray reviewed.  No acute findings. [JK]    Clinical Course User Index [JK] Dorie Rank, MD  Medical Decision Making Differential diagnosis includes but not limited to noncardiac chest pain, acute coronary syndrome, pneumonia, pneumothorax, pulmonary embolism.  Presentation not suggestive of aortic dissection.  Amount and/or Complexity of Data Reviewed Labs: ordered. Radiology: ordered.   Plan was for laboratory tests including cardiac enzymes and D-dimer.  Patient's initial EKG was reassuring.  Chest x-ray did not show any acute findings.  I was notified by nursing staff that the patient was frustrated and did not feel that anything was being done.  Apparently they were unable to draw her labs initially.  I was waiting on the patient's laboratory results and I was notified by pt's RN after the patient had already left that the patient decided to leave AMA.        Final Clinical Impression(s) / ED Diagnoses Final diagnoses:  Chest pain, unspecified type    Rx / DC Orders ED Discharge Orders     None         Dorie Rank, MD 07/28/22 1814

## 2022-07-28 NOTE — ED Triage Notes (Signed)
Pt came personal vehicle from CT outpatient. During scan, pt c/o of chest pain, exam discontinued with recommendations to report to ED for cardiac workup.

## 2022-08-08 ENCOUNTER — Ambulatory Visit: Payer: PPO | Attending: Cardiovascular Disease | Admitting: Cardiovascular Disease

## 2022-08-08 ENCOUNTER — Encounter: Payer: Self-pay | Admitting: Cardiovascular Disease

## 2022-08-08 VITALS — BP 118/56 | HR 65 | Ht 61.0 in | Wt 212.4 lb

## 2022-08-08 DIAGNOSIS — R0609 Other forms of dyspnea: Secondary | ICD-10-CM

## 2022-08-08 DIAGNOSIS — I10 Essential (primary) hypertension: Secondary | ICD-10-CM

## 2022-08-08 DIAGNOSIS — E782 Mixed hyperlipidemia: Secondary | ICD-10-CM | POA: Diagnosis not present

## 2022-08-08 DIAGNOSIS — E785 Hyperlipidemia, unspecified: Secondary | ICD-10-CM | POA: Insufficient documentation

## 2022-08-08 DIAGNOSIS — R072 Precordial pain: Secondary | ICD-10-CM

## 2022-08-08 DIAGNOSIS — R0789 Other chest pain: Secondary | ICD-10-CM | POA: Diagnosis not present

## 2022-08-08 LAB — BASIC METABOLIC PANEL
BUN/Creatinine Ratio: 13 (ref 12–28)
BUN: 11 mg/dL (ref 8–27)
CO2: 24 mmol/L (ref 20–29)
Calcium: 9.9 mg/dL (ref 8.7–10.3)
Chloride: 103 mmol/L (ref 96–106)
Creatinine, Ser: 0.84 mg/dL (ref 0.57–1.00)
Glucose: 90 mg/dL (ref 70–99)
Potassium: 4.8 mmol/L (ref 3.5–5.2)
Sodium: 138 mmol/L (ref 134–144)
eGFR: 76 mL/min/{1.73_m2} (ref >59)

## 2022-08-08 MED ORDER — METOPROLOL TARTRATE 50 MG PO TABS: 50.0000 mg | ORAL_TABLET | Freq: Once | ORAL | 0 refills | Status: AC

## 2022-08-08 MED ORDER — ATORVASTATIN CALCIUM 20 MG PO TABS: 20.0000 mg | ORAL_TABLET | Freq: Every day | ORAL | 3 refills | Status: AC

## 2022-08-08 NOTE — Progress Notes (Signed)
08/08/2022 Margaret Oconnor   Dec 18, 1955  [id number]  Primary Physician Jacalyn Lefevre Jesse Sans, MD Primary Cardiologist: Lorretta Harp MD Lupe Carney, [place]  HPI:  Margaret Oconnor is a 66 y.o. morbidly overweight divorced Caucasian female mother of 3 children, grandmother of 2 grandchildren who recently retired from being a psychiatric nurse practitioner after the recent passing of her mother several months ago.  She was referred by her PCP, Dr. Reymundo Poll, for evaluation of hyperlipidemia.  She smoked remotely.  She drinks wine nightly.  She lives a sedentary lifestyle.  She does have treated hypertension and untreated hyperlipidemia with an LDL in the 150 range.  There is no family history of heart disease.  She is never had heart attack or stroke.  She does complain of chest pain in the early mornings which sounds atypical and dyspnea on exertion which most likely is multifactorial from sedentary lifestyle, deconditioning and obesity.   Current Meds  Medication Sig   ADVAIR HFA 115-21 MCG/ACT inhaler Inhale 2 puffs into the lungs 2 (two) times daily as needed for shortness of breath.   albuterol (VENTOLIN HFA) 108 (90 Base) MCG/ACT inhaler Inhale 2 puffs into the lungs every 6 (six) hours as needed for shortness of breath.   Aspirin-Salicylamide-Caffeine (BC HEADACHE POWDER PO) Take 1 Package by mouth daily as needed (headache).   EPINEPHrine (EPI-PEN) 0.3 mg/0.3 mL DEVI Inject 0.3 mLs (0.3 mg total) into the muscle once.   ibuprofen (ADVIL) 600 MG tablet Take 600 mg by mouth every 6 (six) hours as needed for moderate pain.   ivermectin (STROMECTOL) 3 MG TABS tablet Take 6 mg by mouth 2 (two) times daily as needed (infection).   lisdexamfetamine (VYVANSE) 70 MG capsule Take 1 capsule (70 mg total) by mouth every morning. Fill on or after '7 30 13   '$ omeprazole (PRILOSEC OTC) 20 MG tablet Take 20 mg by mouth daily as needed (acid reflux).   sertraline (ZOLOFT) 100 MG tablet Take 200 mg by mouth  daily.   trazodone (DESYREL) 300 MG tablet Take 300 mg by mouth at bedtime.   valsartan (DIOVAN) 80 MG tablet Take 80 mg by mouth daily.     Allergies  Allergen Reactions   Bee Venom     Social History   Socioeconomic History   Marital status: Single    Spouse name: Not on file   Number of children: Not on file   Years of education: Not on file   Highest education level: Not on file  Occupational History   Not on file  Tobacco Use   Smoking status: Former   Smokeless tobacco: Not on file  Substance and Sexual Activity   Alcohol use: No   Drug use: No   Sexual activity: Not on file  Other Topics Concern   Not on file  Social History Narrative   Works  [employer]  Stressful   psych NP   Divorce  from husband for 1 year  Son with father \    2 other  children out of the house.   Not smoking          G6P3   Social Determinants of Health   Financial Resource Strain: Not on file  Food Insecurity: Not on file  Transportation Needs: Not on file  Physical Activity: Not on file  Stress: Not on file  Social Connections: Not on file  Intimate Partner Violence: Not on file     Review of Systems:  General: negative for chills, fever, night sweats or weight changes.  Cardiovascular: negative for chest pain, dyspnea on exertion, edema, orthopnea, palpitations, paroxysmal nocturnal dyspnea or shortness of breath Dermatological: negative for rash Respiratory: negative for cough or wheezing Urologic: negative for hematuria Abdominal: negative for nausea, vomiting, diarrhea, bright red blood per rectum, melena, or hematemesis Neurologic: negative for visual changes, syncope, or dizziness All other systems reviewed and are otherwise negative except as noted above.    Blood pressure (!) 118/56, pulse 65, height '5\' 1"'$  (1.549 m), weight 212 lb 6.4 oz (96.3 kg), SpO2 98 %.  General appearance: alert and no distress Neck: no adenopathy, no carotid bruit, no JVD, supple,  symmetrical, trachea midline, and thyroid not enlarged, symmetric, no tenderness/mass/nodules Lungs: clear to auscultation bilaterally Heart: regular rate and rhythm, S1, S2 normal, no murmur, click, rub or gallop Extremities: extremities normal, atraumatic, no cyanosis or edema Pulses: 2+ and symmetric Skin: Skin color, texture, turgor normal. No rashes or lesions Neurologic: Grossly normal  EKG sinus rhythm at 65 with early R wave transition nonspecific ST and T wave changes.  I personally reviewed this EKG.  ASSESSMENT AND PLAN:   Hyperlipidemia History of hyperlipidemia not on statin therapy lipid profile performed 07/10/2021 revealing total cholesterol 260, LDL 152 and HDL of 81.  I am going to start her on atorvastatin 20 mg a day and we will recheck a lipid liver profile in 3 months.  Essential hypertension History of essential hypertension blood pressure measured today at 118/56.  She is on valsartan.  Morbid obesity (HCC) BMI of 40.  I am referring her to the [facility] diet wellness center for for physician assisted weight loss.  Atypical chest pain Patient gets typically chest pain in the mornings.  Her risk factors include hypertension hyperlipidemia.  She did have a chest CT performed 04/13/2022 that did not suggest coronary calcification.  I am going to get a coronary CTA to further evaluate.  Dyspnea on exertion Complains of dyspnea on exertion.  She does live a sedentary lifestyle.  She does have morbid obesity as well.  Remote history tobacco abuse.  I am going to get a 2D echo to further evaluate     Lorretta Harp MD [facility], [facility] 08/08/2022 10:05 AM

## 2022-08-08 NOTE — Assessment & Plan Note (Signed)
History of essential hypertension blood pressure measured today at 118/56.  She is on valsartan.

## 2022-08-08 NOTE — Patient Instructions (Addendum)
Medication Instructions:  Your physician has recommended you make the following change in your medication:   -Start atorvastatin (lipitor) '20mg'$  once daily.   *If you need a refill on your cardiac medications before your next appointment, please call your pharmacy*   Lab Work: Your physician recommends that you have labs drawn today: BMET  Your physician recommends that you return for lab work in: 3 months for FASTING lipid/liver panel  If you have labs (blood work) drawn today and your tests are completely normal, you will receive your results only by: [place] (if you have MyChart) OR A paper copy in the mail If you have any lab test that is abnormal or we need to change your treatment, we will call you to review the results.   Testing/Procedures: Your physician has requested that you have an echocardiogram. Echocardiography is a painless test that uses sound waves to create images of your heart. It provides your doctor with information about the size and shape of your heart and how well your heart's chambers and valves are working. This procedure takes approximately one hour. There are no restrictions for this procedure. Please do NOT wear cologne, perfume, aftershave, or lotions (deodorant is allowed). Please arrive 15 minutes prior to your appointment time. This procedure will be done at [address]   Follow-Up: At [facility], you and your health needs are our priority.  As part of our continuing mission to provide you with exceptional heart care, we have created designated Provider Care Teams.  These Care Teams include your primary Cardiologist (physician) and Advanced Practice Providers (APPs -  Physician Assistants and Nurse Practitioners) who all work together to provide you with the care you need, when you need it.  We recommend signing up for the patient portal called "MyChart".  Sign up information is provided on this After Visit Summary.   MyChart is used to connect with patients for Virtual Visits (Telemedicine).  Patients are able to view lab/test results, encounter notes, upcoming appointments, etc.  Non-urgent messages can be sent to your provider as well.   To learn more about what you can do with MyChart, go to [email or web address].    Your next appointment:   6 month(s)  The format for your next appointment:   In Person  Provider:   Quay Burow, MD     Other Instructions   Your cardiac CT will be scheduled at the below location:   [facility] [address] [place], [place] [address] [phone]   If scheduled at [facility], please arrive at the [facility] Entrance (Entrance C2) of [facility] 30 minutes prior to test start time. You can use the FREE valet parking offered at entrance C (encouraged to control the heart rate for the test)  Proceed to the [facility] Radiology Department (first floor) to check-in and test prep.        Please follow these instructions carefully (unless otherwise directed):   On the Night Before the Test: Be sure to Drink plenty of water. Do not consume any caffeinated/decaffeinated beverages or chocolate 12 hours prior to your test. Do not take any antihistamines 12 hours prior to your test.  On the Day of the Test: Drink plenty of water until 1 hour prior to the test. Do not eat any food 1 hour prior to test. You may take your regular medications prior to the test.  Take metoprolol (Lopressor) '50mg'$  two hours  prior to test. HOLD Furosemide/Hydrochlorothiazide morning of the test. FEMALES- please wear underwire-free bra if available, avoid dresses & tight clothing      After the Test: Drink plenty of water. After receiving IV contrast, you may experience a mild flushed feeling. This is normal. On occasion, you may experience a mild rash up to 24 hours after the test. This is not dangerous. If this occurs, you can  take Benadryl 25 mg and increase your fluid intake. If you experience trouble breathing, this can be serious. If it is severe call 911 IMMEDIATELY. If it is mild, please call our office. If you take any of these medications: Glipizide/Metformin, Avandament, Glucavance, please do not take 48 hours after completing test unless otherwise instructed.  We will call to schedule your test 2-4 weeks out understanding that some insurance companies will need an authorization prior to the service being performed.   For non-scheduling related questions, please contact the cardiac imaging nurse navigator should you have any questions/concerns: Marchia Bond, Cardiac Imaging Nurse Navigator Gordy Clement, Cardiac Imaging Nurse Navigator [facility] Direct Office Dial: [phone]   For scheduling needs, including cancellations and rescheduling, please call [place], [phone].

## 2022-08-08 NOTE — Assessment & Plan Note (Signed)
Patient gets typically chest pain in the mornings.  Her risk factors include hypertension hyperlipidemia.  She did have a chest CT performed 04/13/2022 that did not suggest coronary calcification.  I am going to get a coronary CTA to further evaluate.

## 2022-08-08 NOTE — Assessment & Plan Note (Signed)
History of hyperlipidemia not on statin therapy lipid profile performed 07/10/2021 revealing total cholesterol 260, LDL 152 and HDL of 81.  I am going to start her on atorvastatin 20 mg a day and we will recheck a lipid liver profile in 3 months.

## 2022-08-08 NOTE — Assessment & Plan Note (Signed)
BMI of 40.  I am referring her to the [facility] diet wellness center for for physician assisted weight loss.

## 2022-08-08 NOTE — Assessment & Plan Note (Signed)
Complains of dyspnea on exertion.  She does live a sedentary lifestyle.  She does have morbid obesity as well.  Remote history tobacco abuse.  I am going to get a 2D echo to further evaluate

## 2022-08-13 ENCOUNTER — Ambulatory Visit (HOSPITAL_COMMUNITY)
Admission: RE | Admit: 2022-08-13 | Discharge: 2022-08-13 | Disposition: A | Discharge status: Home / Self Care | Payer: PPO | Source: Ambulatory Visit | Attending: Internal Medicine | Admitting: Internal Medicine

## 2022-08-13 DIAGNOSIS — R911 Solitary pulmonary nodule: Secondary | ICD-10-CM | POA: Insufficient documentation

## 2022-08-20 ENCOUNTER — Telehealth (HOSPITAL_COMMUNITY): Payer: Self-pay | Admitting: Emergency Medicine

## 2022-08-20 NOTE — Telephone Encounter (Signed)
Attempted to call patient regarding upcoming cardiac CT appointment. °Left message on voicemail with name and callback number °Nyeemah Alasha Mcguinness RN Navigator Cardiac Imaging °[facility] °[phone] Office °[phone] Cell ° °

## 2022-08-22 ENCOUNTER — Ambulatory Visit (HOSPITAL_COMMUNITY): Payer: PPO

## 2022-08-23 ENCOUNTER — Encounter (INDEPENDENT_AMBULATORY_CARE_PROVIDER_SITE_OTHER): Payer: Self-pay

## 2022-08-30 ENCOUNTER — Ambulatory Visit (HOSPITAL_COMMUNITY): Admission: RE | Admit: 2022-08-30 | Payer: PPO | Source: Ambulatory Visit

## 2022-09-04 ENCOUNTER — Ambulatory Visit (HOSPITAL_COMMUNITY): Payer: PPO | Attending: Cardiovascular Disease

## 2022-09-04 DIAGNOSIS — R0789 Other chest pain: Secondary | ICD-10-CM | POA: Diagnosis not present

## 2022-09-04 DIAGNOSIS — E782 Mixed hyperlipidemia: Secondary | ICD-10-CM | POA: Diagnosis not present

## 2022-09-04 DIAGNOSIS — R072 Precordial pain: Secondary | ICD-10-CM | POA: Diagnosis not present

## 2022-09-04 DIAGNOSIS — I10 Essential (primary) hypertension: Secondary | ICD-10-CM | POA: Diagnosis not present

## 2022-09-04 DIAGNOSIS — R0602 Shortness of breath: Secondary | ICD-10-CM | POA: Diagnosis not present

## 2022-09-04 DIAGNOSIS — R0609 Other forms of dyspnea: Secondary | ICD-10-CM

## 2022-09-04 LAB — ECHOCARDIOGRAM COMPLETE
Area-P 1/2: 2.21 cm2
S' Lateral: 3.4 cm

## 2022-09-10 ENCOUNTER — Telehealth (HOSPITAL_COMMUNITY): Payer: Self-pay | Admitting: Emergency Medicine

## 2022-09-10 NOTE — Telephone Encounter (Signed)
Attempted to call patient regarding upcoming cardiac CT appointment. °Left message on voicemail with name and callback number °Mckaela Sanda Dejoy RN Navigator Cardiac Imaging °[place] [facility] [facility] °[phone] Office °[phone] Cell ° °

## 2022-09-12 ENCOUNTER — Ambulatory Visit (HOSPITAL_COMMUNITY): Admission: RE | Admit: 2022-09-12 | Payer: PPO | Source: Ambulatory Visit

## 2022-09-20 ENCOUNTER — Ambulatory Visit (HOSPITAL_COMMUNITY): Payer: PPO

## 2022-10-02 ENCOUNTER — Telehealth (HOSPITAL_COMMUNITY): Payer: Self-pay | Admitting: *Deleted

## 2022-10-02 NOTE — Telephone Encounter (Signed)
Attempted to call patient regarding upcoming cardiac CT appointment. °Left message on voicemail with name and callback number ° °Meleni Delahunt RN Navigator Cardiac Imaging °[place] °[phone] Office °[phone] Cell ° °

## 2022-10-03 ENCOUNTER — Ambulatory Visit (HOSPITAL_COMMUNITY): Admission: RE | Admit: 2022-10-03 | Payer: PPO | Source: Ambulatory Visit

## 2022-10-15 ENCOUNTER — Encounter (INDEPENDENT_AMBULATORY_CARE_PROVIDER_SITE_OTHER): Payer: Self-pay | Admitting: Family Medicine

## 2022-10-24 ENCOUNTER — Encounter (INDEPENDENT_AMBULATORY_CARE_PROVIDER_SITE_OTHER): Payer: Self-pay | Admitting: Family Medicine

## 2022-10-25 ENCOUNTER — Telehealth (HOSPITAL_COMMUNITY): Payer: Self-pay | Admitting: Emergency Medicine

## 2022-10-25 NOTE — Telephone Encounter (Signed)
Attempted to call patient regarding upcoming cardiac CT appointment. Left message on voicemail with name and callback number Marchia Bond RN Navigator [facility] [phone] Office [phone] Cell

## 2022-10-26 ENCOUNTER — Ambulatory Visit (HOSPITAL_COMMUNITY): Admission: RE | Admit: 2022-10-26 | Payer: PPO | Source: Ambulatory Visit

## 2023-02-06 ENCOUNTER — Ambulatory Visit: Payer: PPO | Admitting: Cardiovascular Disease

## 2023-02-13 ENCOUNTER — Telehealth: Payer: Self-pay

## 2023-02-13 NOTE — Telephone Encounter (Signed)
Attempted to call pt. Sounds like someone answered and hung up. Per Dr. Allyson Sabal, call about cancelling coronary CTA multiple times.

## 2023-03-01 ENCOUNTER — Ambulatory Visit: Payer: PPO | Admitting: Cardiovascular Disease

## 2023-05-31 ENCOUNTER — Ambulatory Visit: Payer: PPO | Admitting: Cardiovascular Disease

## 2023-09-11 ENCOUNTER — Ambulatory Visit: Payer: PPO | Admitting: Cardiovascular Disease

## 2024-07-06 ENCOUNTER — Other Ambulatory Visit: Payer: Self-pay

## 2024-07-06 ENCOUNTER — Encounter (HOSPITAL_BASED_OUTPATIENT_CLINIC_OR_DEPARTMENT_OTHER): Payer: Self-pay | Admitting: *Deleted

## 2024-07-06 ENCOUNTER — Emergency Department (HOSPITAL_BASED_OUTPATIENT_CLINIC_OR_DEPARTMENT_OTHER)
Admission: EM | Admit: 2024-07-06 | Discharge: 2024-07-06 | Disposition: A | Discharge status: Home / Self Care | Attending: Emergency Medicine | Admitting: Emergency Medicine

## 2024-07-06 ENCOUNTER — Emergency Department (HOSPITAL_BASED_OUTPATIENT_CLINIC_OR_DEPARTMENT_OTHER)

## 2024-07-06 DIAGNOSIS — H9201 Otalgia, right ear: Secondary | ICD-10-CM | POA: Diagnosis not present

## 2024-07-06 DIAGNOSIS — F419 Anxiety disorder, unspecified: Secondary | ICD-10-CM | POA: Insufficient documentation

## 2024-07-06 DIAGNOSIS — D329 Benign neoplasm of meninges, unspecified: Secondary | ICD-10-CM | POA: Insufficient documentation

## 2024-07-06 DIAGNOSIS — R1084 Generalized abdominal pain: Secondary | ICD-10-CM | POA: Diagnosis present

## 2024-07-06 DIAGNOSIS — N858 Other specified noninflammatory disorders of uterus: Secondary | ICD-10-CM | POA: Diagnosis not present

## 2024-07-06 DIAGNOSIS — N9489 Other specified conditions associated with female genital organs and menstrual cycle: Secondary | ICD-10-CM

## 2024-07-06 LAB — COMPREHENSIVE METABOLIC PANEL WITH GFR
ALT: 41 U/L (ref 0–44)
AST: 43 U/L — ABNORMAL HIGH (ref 15–41)
Albumin: 4.6 g/dL (ref 3.5–5.0)
Alkaline Phosphatase: 104 U/L (ref 38–126)
Anion gap: 15 (ref 5–15)
BUN: 11 mg/dL (ref 8–23)
CO2: 21 mmol/L — ABNORMAL LOW (ref 22–32)
Calcium: 10 mg/dL (ref 8.9–10.3)
Chloride: 103 mmol/L (ref 98–111)
Creatinine, Ser: 0.88 mg/dL (ref 0.44–1.00)
GFR, Estimated: >60 mL/min (ref >60)
Glucose, Bld: 122 mg/dL — ABNORMAL HIGH (ref 70–99)
Potassium: 4.1 mmol/L (ref 3.5–5.1)
Sodium: 139 mmol/L (ref 135–145)
Total Bilirubin: 0.4 mg/dL (ref 0.0–1.2)
Total Protein: 8.1 g/dL (ref 6.5–8.1)

## 2024-07-06 LAB — URINALYSIS, ROUTINE W REFLEX MICROSCOPIC
Bacteria, UA: NONE SEEN
Bilirubin Urine: NEGATIVE
Glucose, UA: NEGATIVE mg/dL
Hgb urine dipstick: NEGATIVE
Ketones, ur: NEGATIVE mg/dL
Leukocytes,Ua: NEGATIVE
Nitrite: NEGATIVE
Protein, ur: 100 mg/dL — AB
Specific Gravity, Urine: >1.046 — ABNORMAL HIGH (ref 1.005–1.030)
pH: 6 (ref 5.0–8.0)

## 2024-07-06 LAB — CBC
HCT: 43.5 % (ref 36.0–46.0)
Hemoglobin: 15.2 g/dL — ABNORMAL HIGH (ref 12.0–15.0)
MCH: 35.4 pg — ABNORMAL HIGH (ref 26.0–34.0)
MCHC: 34.9 g/dL (ref 30.0–36.0)
MCV: 101.4 fL — ABNORMAL HIGH (ref 80.0–100.0)
Platelets: 307 K/uL (ref 150–400)
RBC: 4.29 MIL/uL (ref 3.87–5.11)
RDW: 14.6 % (ref 11.5–15.5)
WBC: 11.7 K/uL — ABNORMAL HIGH (ref 4.0–10.5)
nRBC: 0 % (ref 0.0–0.2)

## 2024-07-06 LAB — LIPASE, BLOOD: Lipase: 22 U/L (ref 11–51)

## 2024-07-06 MED ORDER — SODIUM CHLORIDE 0.9 % IV SOLN: INTRAVENOUS | Status: DC | PRN

## 2024-07-06 MED ORDER — KETOROLAC TROMETHAMINE 15 MG/ML IJ SOLN
15.0000 mg | Freq: Once | INTRAMUSCULAR | Status: AC
  Administered 2024-07-06: 15 mg via INTRAVENOUS
  Filled 2024-07-06: qty 1

## 2024-07-06 MED ORDER — IOHEXOL 300 MG/ML  SOLN
100.0000 mL | Freq: Once | INTRAMUSCULAR | Status: AC | PRN
  Administered 2024-07-06: 100 mL via INTRAVENOUS

## 2024-07-06 MED ORDER — PROCHLORPERAZINE EDISYLATE 10 MG/2ML IJ SOLN
10.0000 mg | Freq: Once | INTRAMUSCULAR | Status: AC
  Administered 2024-07-06: 10 mg via INTRAVENOUS
  Filled 2024-07-06: qty 2

## 2024-07-06 MED ORDER — OMEPRAZOLE MAGNESIUM 20 MG PO TBEC: 20.0000 mg | DELAYED_RELEASE_TABLET | Freq: Every day | ORAL | 0 refills | Status: AC | PRN

## 2024-07-06 MED ORDER — LACTATED RINGERS IV BOLUS
1000.0000 mL | Freq: Once | INTRAVENOUS | Status: AC
  Administered 2024-07-06: 1000 mL via INTRAVENOUS

## 2024-07-06 MED ORDER — PIPERACILLIN-TAZOBACTAM 3.375 G IVPB 30 MIN
3.3750 g | Freq: Once | INTRAVENOUS | Status: AC
  Administered 2024-07-06: 3.375 g via INTRAVENOUS
  Filled 2024-07-06: qty 50

## 2024-07-06 MED ORDER — AMOXICILLIN-POT CLAVULANATE 875-125 MG PO TABS: 1.0000 | ORAL_TABLET | Freq: Two times a day (BID) | ORAL | 0 refills | Status: AC

## 2024-07-06 MED ORDER — ACETAMINOPHEN 500 MG PO TABS
1000.0000 mg | ORAL_TABLET | Freq: Once | ORAL | Status: AC
  Administered 2024-07-06: 1000 mg via ORAL
  Filled 2024-07-06: qty 2

## 2024-07-06 MED ORDER — DIPHENHYDRAMINE HCL 50 MG/ML IJ SOLN
12.5000 mg | Freq: Once | INTRAMUSCULAR | Status: DC
Start: 2024-07-06 — End: 2024-07-06
  Filled 2024-07-06: qty 1

## 2024-07-06 MED ORDER — IOHEXOL 300 MG/ML  SOLN
75.0000 mL | Freq: Once | INTRAMUSCULAR | Status: AC | PRN
  Administered 2024-07-06: 75 mL via INTRAVENOUS

## 2024-07-06 MED ORDER — METOCLOPRAMIDE HCL 5 MG/ML IJ SOLN
10.0000 mg | Freq: Once | INTRAMUSCULAR | Status: DC
  Filled 2024-07-06: qty 2

## 2024-07-06 MED ORDER — VANCOMYCIN HCL IN DEXTROSE 1-5 GM/200ML-% IV SOLN
1000.0000 mg | Freq: Once | INTRAVENOUS | Status: AC
  Administered 2024-07-06: 1000 mg via INTRAVENOUS
  Filled 2024-07-06: qty 200

## 2024-07-06 MED ORDER — KETOROLAC TROMETHAMINE 10 MG PO TABS: 10.0000 mg | ORAL_TABLET | Freq: Four times a day (QID) | ORAL | 0 refills | Status: AC | PRN

## 2024-07-06 MED ORDER — VANCOMYCIN HCL IN DEXTROSE 1-5 GM/200ML-% IV SOLN: 1000.0000 mg | Freq: Once | INTRAVENOUS | Status: DC

## 2024-07-06 NOTE — ED Notes (Signed)
 ED Provider at bedside.

## 2024-07-06 NOTE — ED Notes (Signed)
 Patient back from CT

## 2024-07-06 NOTE — ED Triage Notes (Addendum)
 Pt has a couple concerns today. C/o right headache/ear pain. States she has seen an ENT for fluid in her right ear, but this is still bothering her. C/o upper right abd pain/right side of body hurting for the past couple months but worse today. States she was told she needed her gallbladder removed a couple years ago, but has not scheduled this.  C/o nausea. Denies any fevers. C/o abd swelling and sores on her body.

## 2024-07-06 NOTE — Progress Notes (Signed)
 ED Pharmacy Antibiotic Sign Off An antibiotic consult was received from an ED provider for vancomycin and zosyn per pharmacy dosing for mastoiditis. A chart review was completed to assess appropriateness.   The following one time order(s) were placed:  Vancomycin 2g Zosyn 3.375g  Further antibiotic and/or antibiotic pharmacy consults should be ordered by the admitting provider if indicated.   Thank you for allowing pharmacy to be a part of this patient's care.   Delesia Martinek, Vernell Helling, [facility]  Clinical Pharmacist 07/06/24 9:13 AM

## 2024-07-06 NOTE — ED Notes (Signed)
 Patient given discharge instructions. Questions were answered. Patient verbalized understanding of discharge instructions and care at home.

## 2024-07-06 NOTE — ED Notes (Addendum)
 Patient educated again on providing urine sample when using restroom at this time.

## 2024-07-06 NOTE — ED Notes (Signed)
 Patient transported to CT

## 2024-07-06 NOTE — Discharge Instructions (Addendum)
 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  You are following up outpatient with ENT.  I spoke with on-call ENT who recommended we start you on antibiotics.  Additionally, your CT of the abdomen pelvis revealed an endometrial mass for which it is recommend you follow-up outpatient with OB/GYN for biopsy to further evaluate.

## 2024-07-06 NOTE — ED Provider Notes (Signed)
 Margaret Oconnor EMERGENCY DEPARTMENT AT [facility] Provider Note   CSN: [id number] Arrival date & time: 07/06/24  9367     Patient presents with: Abdominal Pain   Margaret Oconnor is a 68 y.o. female.    Abdominal Pain Associated symptoms: nausea      68 year old female with medical history significant for obesity, meningioma of the brain, IBS, substance abuse previously on Suboxone presenting to the emergency department with multiple complaints.  The patient states that she has had issues for years and is concerned that she has had parasites that have gone undiagnosed.  She states that she primarily came to the emergency department because she has been having increasing frequency of headaches over the past month.  No thunderclap nature, endorses right sided throbbing headache, light sensitivity, sound sensitivity, associated nausea, no vomiting.  She is worried that she has had a change in her brain meningioma.  She endorses numbness along the right side of her face that is also been ongoing for the past month.  She endorses a 7 out of 10 headache this morning.  Not sudden in onset or maximal in onset.  No fevers.  She endorses chronic neck discomfort.  Additionally, she has been under stress recently as her ex-husband recently got remarried.  She endorses persistent fears regarding potential parasites in her abdomen that could be causing skin excoriations which have also been present for years.  She denies picking at her skin.  She denies SI or HI.  Her last bowel movement was this morning.  She is passing gas.  She endorses generalized abdominal pain, some focality in the right upper quadrant.  She previously had colicky right upper quadrant pain and had been told at one point that her gallbladder needed to be taken out.  She endorses mild dysuria.  Prior to Admission medications   Medication Sig Start Date End Date Taking? Authorizing Provider  amoxicillin-clavulanate (AUGMENTIN)  875-125 MG tablet Take 1 tablet by mouth every 12 (twelve) hours. 07/06/24  Yes Jerrol Agent, MD  ketorolac (TORADOL) 10 MG tablet Take 1 tablet (10 mg total) by mouth every 6 (six) hours as needed. 07/06/24  Yes Jerrol Agent, MD  ADVAIR [facility] 115-21 MCG/ACT inhaler Inhale 2 puffs into the lungs 2 (two) times daily as needed for shortness of breath. 03/19/21   [provider]  albendazole (ALBENZA) 200 MG tablet Take 200 mg by mouth 2 (two) times daily as needed (infection).    [provider]  albuterol (VENTOLIN HFA) 108 (90 Base) MCG/ACT inhaler Inhale 2 puffs into the lungs every 6 (six) hours as needed for shortness of breath. 05/04/19   [provider]  Aspirin-Salicylamide-Caffeine (BC HEADACHE POWDER PO) Take 1 Package by mouth daily as needed (headache).    [provider]  atorvastatin  (LIPITOR) 20 MG tablet Take 1 tablet (20 mg total) by mouth daily. 08/08/22   Court Dorn PARAS, MD  EPINEPHrine  (EPI-PEN) 0.3 mg/0.3 mL DEVI Inject 0.3 mLs (0.3 mg total) into the muscle once. 01/27/13   Panosh, Wanda K, MD  ivermectin (STROMECTOL) 3 MG TABS tablet Take 6 mg by mouth 2 (two) times daily as needed (infection). 06/16/21   [provider]  lisdexamfetamine (VYVANSE ) 70 MG capsule Take 1 capsule (70 mg total) by mouth every morning. Fill on or after 7 30 13  02/22/12   Panosh, Apolinar POUR, MD  metoprolol  tartrate (LOPRESSOR ) 50 MG tablet Take 1 tablet (50 mg total) by mouth once for 1 dose. 08/08/22 08/08/22  Court Dorn PARAS, MD  omeprazole (PRILOSEC OTC) 20 MG tablet Take 1 tablet (20 mg total) by mouth daily as needed (acid reflux). 07/06/24 08/05/24  Jerrol Agent, MD  sertraline (ZOLOFT) 100 MG tablet Take 200 mg by mouth daily.    [provider]  trazodone (DESYREL) 300 MG tablet Take 300 mg by mouth at bedtime.    [provider]  valsartan (DIOVAN) 80 MG tablet Take 80 mg by mouth daily.    [provider]    Allergies:  Bee venom    Review of Systems  Gastrointestinal:  Positive for abdominal pain and nausea.  Neurological:  Positive for headaches.  All other systems reviewed and are negative.   Updated Vital Signs BP (!) 142/98   Pulse 66   Temp 97.9 F (36.6 C) (Oral)   Resp 20   Ht 5' 1 (1.549 m)   SpO2 96%   BMI 40.13 kg/m   Physical Exam Vitals and nursing note reviewed.  Constitutional:      General: She is not in acute distress.    Appearance: She is obese.  HENT:     Head: Normocephalic and atraumatic.     Right Ear: Tympanic membrane normal. There is mastoid tenderness.  Eyes:     Conjunctiva/sclera: Conjunctivae normal.  Cardiovascular:     Rate and Rhythm: Normal rate and regular rhythm.     Heart sounds: No murmur heard. Pulmonary:     Effort: Pulmonary effort is normal. No respiratory distress.     Breath sounds: Normal breath sounds.  Abdominal:     Palpations: Abdomen is soft.     Tenderness: There is generalized abdominal tenderness. There is no guarding.  Musculoskeletal:        General: No swelling.     Cervical back: Neck supple.  Skin:    General: Skin is warm and dry.     Capillary Refill: Capillary refill takes less than 2 seconds.     Comments: Scattered skin excoriations noted  Neurological:     Mental Status: She is alert.     Comments: MENTAL STATUS EXAM:    Orientation: Alert and oriented to person, place and time.  Memory: Cooperative, follows commands well.  Language: Speech is clear and language is normal.   CRANIAL NERVES:    CN 2 (Optic): Visual fields intact to confrontation.  CN 3,4,6 (EOM): Pupils equal and reactive to light. Full extraocular eye movement without nystagmus.  CN 5 (Trigeminal): Facial sensation is diminished on the right, no weakness of masticatory muscles.  CN 7 (Facial): No facial weakness or asymmetry.  CN 8 (Auditory): Auditory acuity grossly normal.  CN 9,10 (Glossophar): The uvula is midline, the palate elevates  symmetrically.  CN 11 (spinal access): Normal sternocleidomastoid and trapezius strength.  CN 12 (Hypoglossal): The tongue is midline. No atrophy or fasciculations.SABRA   MOTOR:  Muscle Strength: 5/5RUE, 5/5LUE, 5/5RLE, 5/5LLE.   COORDINATION:   Intact finger-to-nose, no tremor.   SENSATION:   Intact to light touch all four extremities, diminished in the right hemibody to light touch.  GAIT: Gait not assessed   Psychiatric:        Attention and Perception: Attention and perception normal. She does not perceive auditory or visual hallucinations.        Mood and Affect: Mood is anxious.        Thought Content: Thought content does not include homicidal or suicidal ideation.     (all labs ordered are listed, but  only abnormal results are displayed) Labs Reviewed  COMPREHENSIVE METABOLIC PANEL WITH GFR - Abnormal; Notable for the following components:      Result Value   CO2 21 (*)    Glucose, Bld 122 (*)    AST 43 (*)    All other components within normal limits  CBC - Abnormal; Notable for the following components:   WBC 11.7 (*)    Hemoglobin 15.2 (*)    MCV 101.4 (*)    MCH 35.4 (*)    All other components within normal limits  URINALYSIS, ROUTINE W REFLEX MICROSCOPIC - Abnormal; Notable for the following components:   Specific Gravity, Urine >1.046 (*)    Protein, ur 100 (*)    All other components within normal limits  LIPASE, BLOOD    EKG: None  Radiology: CT Temporal Bones W Contrast Result Date: 07/06/2024 EXAM: CT TEMPORAL BONES WITH CONTRAST 07/06/2024 09:21:49 AM TECHNIQUE: CT of the temporal bones was performed with the administration of 75 mL of iohexol  (OMNIPAQUE ) 300 MG/ML solution. Multiplanar reformatted images are provided for review. Automated exposure control, iterative reconstruction, and/or weight based adjustment of the mA/kV was utilized to reduce the radiation dose to as low as reasonably achievable. COMPARISON: Head CT today reported separately.  CLINICAL HISTORY: 68 year old female with right headache/ear pain, fluid in right ear, and concern for mastoiditis. FINDINGS: RIGHT TEMPORAL BONE: EXTERNAL AUDITORY CANAL: Well aerated with mild debris. No bony erosion. Scutum is intact. MIDDLE EAR CAVITY: Subtotal opacification of the right tympanic cavity. No discrete abnormality of the right tympanic membrane is evident. The right scutum and ossicles remain intact and appear normally aligned. The right epitympanum is partially aerated. MASTOID AIR CELLS: Subtotal opacification of the right mastoid antrum. Most mastoid air cells are opacified. No coalescence or bone erosion identified. INNER EAR: The cochlea and vestibule are unremarkable. Normal mineralization of the otic capsule. Normal semicircular canals. The vestibular aqueduct is not dilated. INTERNAL AUDITORY CANAL: Unremarkable. Normal bony canal of the facial nerve. LEFT TEMPORAL BONE: EXTERNAL AUDITORY CANAL: Well aerated. No bony erosion. Scutum is intact. MIDDLE EAR CAVITY: Clear. Ossicular chain is intact and aligned. MASTOID AIR CELLS: Clear. INNER EAR: The cochlea and vestibule are unremarkable. Normal mineralization of the otic capsule. Normal semicircular canals. The vestibular aqueduct is not dilated. INTERNAL AUDITORY CANAL: Unremarkable. Normal bony canal of the facial nerve. VASCULAR: Major vascular structures at the skull base are enhancing and appear to be patent, including the right sigmoid sinus and right IJ bulb. Normal carotid canals. BRAIN: Calcified right posterior fossa meningioma, incidentally noted to be enhancing as expected. ORBITS: No acute abnormality. SINUSES: Clear. Periarticular soft tissues and visible deep soft tissue spaces of the face appear normal. IMPRESSION: 1. Right middle ear and mastoid subtotal opacification re-demonstrated. No osseous erosion or other complicating features by CT. 2.  Negative left temporal bone. 3.  Known posterior fossa meningioma. Electronically  signed by: Helayne Hurst MD 07/06/2024 09:59 AM EDT RP Workstation: [id number]   CT ABDOMEN PELVIS W CONTRAST Result Date: 07/06/2024 CLINICAL DATA:  Right upper abdominal pain EXAM: CT ABDOMEN AND PELVIS WITH CONTRAST TECHNIQUE: Multidetector CT imaging of the abdomen and pelvis was performed using the standard protocol following bolus administration of intravenous contrast. RADIATION DOSE REDUCTION: This exam was performed according to the departmental dose-optimization program which includes automated exposure control, adjustment of the mA and/or kV according to patient size and/or use of iterative reconstruction technique. CONTRAST:  OMNIPAQUE  IOHEXOL  300 MG/ML  SOLN COMPARISON:  CT abdomen/pelvis 04/11/2022 FINDINGS: Lower chest: No acute abnormality. Hepatobiliary: Diffuse low attenuation of the hepatic parenchyma consistent with steatosis. There is faint sparing adjacent to the gallbladder fossa. Small stones are present in the gallbladder lumen. No inflammatory changes. No intra or extrahepatic biliary ductal dilatation. Pancreas: Unremarkable. No pancreatic ductal dilatation or surrounding inflammatory changes. Spleen: Normal in size without focal abnormality. Adrenals/Urinary Tract: Adrenal glands are unremarkable. Kidneys are normal, without renal calculi, focal lesion, or hydronephrosis. Bladder is unremarkable. Stomach/Bowel: Stomach is within normal limits. Appendix appears normal. No evidence of bowel wall thickening, distention, or inflammatory changes. Vascular/Lymphatic: Scattered calcified atherosclerotic plaque. No evidence of aneurysm. No suspicious lymphadenopathy. Reproductive: Abnormal widening of the endometrial cavity at 2.2 cm with heterogeneously enhancing soft tissue. This appears new compared to prior imaging from July of 2023. The adnexa are unremarkable. Other: No abdominal wall hernia or abnormality. No abdominopelvic ascites. Musculoskeletal: Grade 1 anterolisthesis of L4 on  L5 with associated degenerative disc disease. Additionally, there are focal degenerative changes at T10-T11. No lytic or blastic osseous lesion. IMPRESSION: 1. Abnormal appearance of the uterus with heterogeneously enhancing soft tissue expanding the endometrial canal to 2.2 cm. Findings are concerning for endometrial cancer versus endometrial polyps. Recommend referral to OBGYN for possible endometrial biopsy. Alternately, MRI of the pelvis with gadolinium contrast could further evaluate. 2. Hepatic steatosis. 3. Cholelithiasis without secondary findings of acute cholecystitis. 4.  Aortic Atherosclerosis (ICD10-I70.0). 5. Grade 1 anterolisthesis of L4 on L5 with associated focal degenerative disc disease. Electronically Signed   By: Wilkie Lent M.D.   On: 07/06/2024 09:01   CT Head Wo Contrast Result Date: 07/06/2024 EXAM: CT HEAD WITHOUT CONTRAST 07/06/2024 08:23:36 AM TECHNIQUE: CT of the head was performed without the administration of intravenous contrast. Automated exposure control, iterative reconstruction, and/or weight based adjustment of the mA/kV was utilized to reduce the radiation dose to as low as reasonably achievable. COMPARISON: Brain MRI 06/03/2018. CLINICAL HISTORY: 68 year old female. Headache, increasing frequency or severity. Right headache/ear pain. Upper right abdominal pain. Nausea. FINDINGS: BRAIN AND VENTRICLES: No acute hemorrhage. No evidence of acute infarct. No hydrocephalus. No extra-axial collection. No mass effect or midline shift. Chronic right posterior fossa calcified meningioma this measures about 13 x 16 x 20 mm (AP by transverse by CC), and is not significantly changed or size in configuration from the previous MRI. Stable mass effect on the posterior right cerebellum there. No cerebellar edema, and no other posterior fossa mass effect. Basilar cisterns are patent. Background brain volume is within normal limits for age. No suspicious intracranial vascular  hyperdensity. Mild motion artifact at the vertex. ORBITS: No acute abnormality. SINUSES: Only trace paranasal sinus fluid or mucosal thickening in the left sphenoid. SOFT TISSUES AND SKULL: No acute soft tissue abnormality. No skull fracture. Confluent opacification in the right middle ear and mastoids is new. Contralateral left tympanic cavity and mastoids appear clear. External auditory canals also well aerated. No bone erosion or complicating features identified at the right middle ear. IMPRESSION: 1. New right middle ear and mastoid opacification. Consider acute otitis media, mastoid effusion. No visible bone erosion or complicating feature on plain CT. 2. No acute intracranial abnormality. Stable chronic calcified right posterior fossa meningioma ( 2 cm ) without edema or progressive mass effect. Electronically signed by: Helayne Hurst MD 07/06/2024 08:30 AM EDT RP Workstation: [id number]     Procedures   Medications Ordered in the ED  vancomycin (VANCOCIN) IVPB 1000 mg/200 mL premix (  0 mg Intravenous Stopped 07/06/24 1207)    Followed by  vancomycin (VANCOCIN) IVPB 1000 mg/200 mL premix (has no administration in time range)  0.9 %  sodium chloride infusion (has no administration in time range)  ketorolac (TORADOL) 15 MG/ML injection 15 mg (15 mg Intravenous Given 07/06/24 0739)  acetaminophen (TYLENOL) tablet 1,000 mg (1,000 mg Oral Given 07/06/24 0739)  lactated ringers bolus 1,000 mL (0 mLs Intravenous Stopped 07/06/24 0926)  prochlorperazine (COMPAZINE) injection 10 mg (10 mg Intravenous Given 07/06/24 0747)  iohexol  (OMNIPAQUE ) 300 MG/ML solution 100 mL (100 mLs Intravenous Contrast Given 07/06/24 0815)  iohexol  (OMNIPAQUE ) 300 MG/ML solution 75 mL (75 mLs Intravenous Contrast Given 07/06/24 0909)  piperacillin-tazobactam (ZOSYN) IVPB 3.375 g (0 g Intravenous Stopped 07/06/24 1012)                                    Medical Decision Making Amount and/or Complexity of Data  Reviewed Labs: ordered. Radiology: ordered.  Risk OTC drugs. Prescription drug management.     68 year old female with medical history significant for obesity, meningioma of the brain, IBS, substance abuse previously on Suboxone presenting to the emergency department with multiple complaints.  The patient states that she has had issues for years and is concerned that she has had parasites that have gone undiagnosed.  She states that she primarily came to the emergency department because she has been having increasing frequency of headaches over the past month.  No thunderclap nature, endorses right sided throbbing headache, light sensitivity, sound sensitivity, associated nausea, no vomiting.  She is worried that she has had a change in her brain meningioma.  She endorses numbness along the right side of her face that is also been ongoing for the past month.  She endorses a 7 out of 10 headache this morning.  Not sudden in onset or maximal in onset.  No fevers.  She endorses chronic neck discomfort.  Additionally, she has been under stress recently as her ex-husband recently got remarried.  She endorses persistent fears regarding potential parasites in her abdomen that could be causing skin excoriations which have also been present for years.  She denies picking at her skin.  She denies SI or HI.  Her last bowel movement was this morning.  She is passing gas.  She endorses generalized abdominal pain, some focality in the right upper quadrant.  She previously had colicky right upper quadrant pain and had been told at one point that her gallbladder needed to be taken out.  She endorses mild dysuria.  On arrival, the patient was vitally stable, afebrile, not tachycardic or tachypneic, saturating well on room air, initially hypertensive BP 170/94, improved to 142/98 after headache cocktail.  On exam the patient had mild mastoid tenderness, no significant fluctuance or erythema, TMs symmetric bilaterally.   Lungs clear to auscultation bilaterally.  Patient with left lower quadrant tenderness to palpation.  Differential diagnosis includes mastoiditis, otitis media less likely, migraine headache, worsening intracranial mass, less likely meningitis/encephalitis, no meningismus on exam, low concern for other acute intracranial abnormality.  Considered intra-abdominal abnormality such as diverticulitis, malignancy, bowel obstruction, constipation.  Labs: Urinalysis negative for UTI, CMP generally unremarkable, mildly low bicarb at 21, CBC with evidence of hemoconcentration with a hemoglobin of 15.2, WBC 11.7.  Patient was administered Compazine, Toradol, Tylenol, IV fluid bolus.  CT head: IMPRESSION:  1. New right middle ear and mastoid opacification. Consider acute  otitis media,  mastoid effusion. No visible bone erosion or complicating feature on plain CT.  2. No acute intracranial abnormality. Stable chronic calcified right posterior  fossa meningioma ( 2 cm ) without edema or progressive mass effect.    Patient was covered for possible mastoiditis with vancomycin and Zosyn.  CT temporal bones: IMPRESSION:  1. Right middle ear and mastoid subtotal opacification re-demonstrated. No  osseous erosion or other complicating features by CT.  2.  Negative left temporal bone.  3.  Known posterior fossa meningioma.    CT Abd Pel: IMPRESSION:  1. Abnormal appearance of the uterus with heterogeneously enhancing  soft tissue expanding the endometrial canal to 2.2 cm. Findings are  concerning for endometrial cancer versus endometrial polyps.  Recommend referral to OBGYN for possible endometrial biopsy.  Alternately, MRI of the pelvis with gadolinium contrast could  further evaluate.  2. Hepatic steatosis.  3. Cholelithiasis without secondary findings of acute cholecystitis.  4.  Aortic Atherosclerosis (ICD10-I70.0).  5. Grade 1 anterolisthesis of L4 on L5 with associated focal  degenerative disc  disease.    I spoke with Dr. Eldonna of Gyn Onc who recommended outpatient follow-up with routine referral to benign gyncecology for outpatient endometrial biopsy.   I spoke with Dr. Tobie of on-call otolaryngology who felt mastoiditis less likely, did recommend discharge on antibiotics (Augmentin) and close follow-up with ENT for repeat assessment.  Patient on repeat assessment was feeling symptomatically improved, no acute findings to suggest need for immediate hospitalization and with improvement, feel the patient is stable for discharge with outpatient specialty follow-up.  Return precautions provided, patient overall stable for discharge at this time.     Final diagnoses:  Pain of right mastoid  Meningioma [facility])  Endometrial mass    ED Discharge Orders          Ordered    Ambulatory referral to Obstetrics / Gynecology        07/06/24 1148    amoxicillin-clavulanate (AUGMENTIN) 875-125 MG tablet  Every 12 hours        07/06/24 1148    ketorolac (TORADOL) 10 MG tablet  Every 6 hours PRN        07/06/24 1208    omeprazole (PRILOSEC OTC) 20 MG tablet  Daily PRN        07/06/24 1209               Jerrol Agent, MD 07/06/24 1235

## 2024-07-06 NOTE — ED Notes (Signed)
 Patient aware urine sample is needed. Unable to give urine sample at this time.

## 2024-10-28 ENCOUNTER — Ambulatory Visit: Admitting: Cardiovascular Disease

## 2024-11-25 ENCOUNTER — Ambulatory Visit: Admitting: Cardiovascular Disease
